# Patient Record
Sex: Female | Born: 1975 | Race: Black or African American | Hispanic: No | Marital: Single | Smoking: Current every day smoker
Health system: Southern US, Community
[De-identification: ages and names within clinical notes are randomized; demographics above are authoritative.]

## PROBLEM LIST (undated history)

## (undated) DIAGNOSIS — F329 Major depressive disorder, single episode, unspecified: Secondary | ICD-10-CM

## (undated) DIAGNOSIS — F319 Bipolar disorder, unspecified: Secondary | ICD-10-CM

## (undated) DIAGNOSIS — F32A Depression, unspecified: Secondary | ICD-10-CM

## (undated) DIAGNOSIS — I10 Essential (primary) hypertension: Secondary | ICD-10-CM

## (undated) DIAGNOSIS — R011 Cardiac murmur, unspecified: Secondary | ICD-10-CM

## (undated) DIAGNOSIS — D649 Anemia, unspecified: Secondary | ICD-10-CM

## (undated) HISTORY — PX: TUBAL LIGATION: SHX77

---

## 2005-09-06 ENCOUNTER — Emergency Department (HOSPITAL_COMMUNITY): Admission: EM | Admit: 2005-09-06 | Discharge: 2005-09-06 | Payer: Self-pay | Admitting: Emergency Medicine

## 2005-12-23 ENCOUNTER — Emergency Department (HOSPITAL_COMMUNITY): Admission: EM | Admit: 2005-12-23 | Discharge: 2005-12-23 | Payer: Self-pay | Admitting: Emergency Medicine

## 2006-02-13 ENCOUNTER — Emergency Department (HOSPITAL_COMMUNITY): Admission: EM | Admit: 2006-02-13 | Discharge: 2006-02-13 | Payer: Self-pay | Admitting: Emergency Medicine

## 2006-02-14 ENCOUNTER — Emergency Department (HOSPITAL_COMMUNITY): Admission: EM | Admit: 2006-02-14 | Discharge: 2006-02-14 | Payer: Self-pay | Admitting: Emergency Medicine

## 2006-02-22 ENCOUNTER — Emergency Department (HOSPITAL_COMMUNITY): Admission: EM | Admit: 2006-02-22 | Discharge: 2006-02-22 | Payer: Self-pay | Admitting: Emergency Medicine

## 2006-05-14 ENCOUNTER — Ambulatory Visit: Payer: Self-pay | Admitting: *Deleted

## 2006-05-14 ENCOUNTER — Ambulatory Visit: Payer: Self-pay | Admitting: Family Medicine

## 2007-04-29 ENCOUNTER — Encounter (INDEPENDENT_AMBULATORY_CARE_PROVIDER_SITE_OTHER): Payer: Self-pay | Admitting: *Deleted

## 2007-05-19 ENCOUNTER — Emergency Department (HOSPITAL_COMMUNITY): Admission: EM | Admit: 2007-05-19 | Discharge: 2007-05-19 | Payer: Self-pay | Admitting: Emergency Medicine

## 2007-12-10 ENCOUNTER — Emergency Department (HOSPITAL_COMMUNITY): Admission: EM | Admit: 2007-12-10 | Discharge: 2007-12-10 | Payer: Self-pay | Admitting: Family Medicine

## 2007-12-25 ENCOUNTER — Ambulatory Visit: Payer: Self-pay | Admitting: Family Medicine

## 2007-12-25 LAB — CONVERTED CEMR LAB
AST: 15 units/L (ref 0–37)
Albumin: 4.8 g/dL (ref 3.5–5.2)
Alkaline Phosphatase: 74 units/L (ref 39–117)
CO2: 24 meq/L (ref 19–32)
Calcium: 9.2 mg/dL (ref 8.4–10.5)
Cholesterol: 155 mg/dL (ref 0–200)
Creatinine, Ser: 0.83 mg/dL (ref 0.40–1.20)
Glucose, Bld: 72 mg/dL (ref 70–99)
Hemoglobin: 13.7 g/dL (ref 12.0–15.0)
Lymphocytes Relative: 27 % (ref 12–46)
Monocytes Absolute: 0.3 10*3/uL (ref 0.1–1.0)
Monocytes Relative: 6 % (ref 3–12)
Neutro Abs: 3.3 10*3/uL (ref 1.7–7.7)
Platelets: 167 10*3/uL (ref 150–400)
RDW: 17.5 % — ABNORMAL HIGH (ref 11.5–15.5)
Sodium: 140 meq/L (ref 135–145)
Total Protein: 8.1 g/dL (ref 6.0–8.3)

## 2008-09-14 ENCOUNTER — Emergency Department (HOSPITAL_COMMUNITY): Admission: EM | Admit: 2008-09-14 | Discharge: 2008-09-14 | Payer: Self-pay | Admitting: Emergency Medicine

## 2008-11-24 ENCOUNTER — Ambulatory Visit: Payer: Self-pay | Admitting: Family Medicine

## 2008-12-28 ENCOUNTER — Ambulatory Visit: Payer: Self-pay | Admitting: Family Medicine

## 2008-12-28 LAB — CONVERTED CEMR LAB
Calcium: 9 mg/dL (ref 8.4–10.5)
Chloride: 110 meq/L (ref 96–112)
Creatinine, Ser: 0.73 mg/dL (ref 0.40–1.20)
Glucose, Bld: 88 mg/dL (ref 70–99)
Hemoglobin: 10.3 g/dL — ABNORMAL LOW (ref 12.0–15.0)
MCHC: 31.4 g/dL (ref 30.0–36.0)
MCV: 67.6 fL — ABNORMAL LOW (ref 78.0–100.0)
Monocytes Absolute: 0.2 10*3/uL (ref 0.1–1.0)
Monocytes Relative: 6 % (ref 3–12)
Neutro Abs: 1.6 10*3/uL — ABNORMAL LOW (ref 1.7–7.7)
Potassium: 4 meq/L (ref 3.5–5.3)
RDW: 20.1 % — ABNORMAL HIGH (ref 11.5–15.5)
Sodium: 142 meq/L (ref 135–145)
WBC: 3.7 10*3/uL — ABNORMAL LOW (ref 4.0–10.5)

## 2009-03-03 ENCOUNTER — Emergency Department (HOSPITAL_COMMUNITY): Admission: EM | Admit: 2009-03-03 | Discharge: 2009-03-03 | Payer: Self-pay | Admitting: Emergency Medicine

## 2009-03-23 ENCOUNTER — Other Ambulatory Visit: Admission: RE | Admit: 2009-03-23 | Discharge: 2009-03-23 | Payer: Self-pay | Admitting: Family Medicine

## 2009-03-23 ENCOUNTER — Encounter (INDEPENDENT_AMBULATORY_CARE_PROVIDER_SITE_OTHER): Payer: Self-pay | Admitting: Family Medicine

## 2009-03-23 ENCOUNTER — Ambulatory Visit: Payer: Self-pay | Admitting: Family Medicine

## 2009-03-23 LAB — CONVERTED CEMR LAB: GC Probe Amp, Genital: NEGATIVE

## 2009-03-29 ENCOUNTER — Ambulatory Visit: Payer: Self-pay | Admitting: Family Medicine

## 2009-07-13 ENCOUNTER — Emergency Department (HOSPITAL_COMMUNITY): Admission: EM | Admit: 2009-07-13 | Discharge: 2009-07-13 | Payer: Self-pay | Admitting: Emergency Medicine

## 2009-09-29 ENCOUNTER — Emergency Department (HOSPITAL_COMMUNITY): Admission: EM | Admit: 2009-09-29 | Discharge: 2009-09-29 | Payer: Self-pay | Admitting: Emergency Medicine

## 2009-11-03 ENCOUNTER — Emergency Department (HOSPITAL_COMMUNITY): Admission: EM | Admit: 2009-11-03 | Discharge: 2009-11-03 | Payer: Self-pay | Admitting: Emergency Medicine

## 2009-12-11 ENCOUNTER — Emergency Department (HOSPITAL_COMMUNITY): Admission: EM | Admit: 2009-12-11 | Discharge: 2009-12-11 | Payer: Self-pay | Admitting: Emergency Medicine

## 2010-11-13 LAB — COMPREHENSIVE METABOLIC PANEL
ALT: 12 U/L (ref 0–35)
Albumin: 3.6 g/dL (ref 3.5–5.2)
Alkaline Phosphatase: 66 U/L (ref 39–117)
BUN: 9 mg/dL (ref 6–23)
CO2: 24 mEq/L (ref 19–32)
Calcium: 8.3 mg/dL — ABNORMAL LOW (ref 8.4–10.5)
Chloride: 103 mEq/L (ref 96–112)
Creatinine, Ser: 0.85 mg/dL (ref 0.4–1.2)
GFR calc Af Amer: >60 mL/min (ref >60)
Glucose, Bld: 90 mg/dL (ref 70–99)
Total Protein: 7.1 g/dL (ref 6.0–8.3)

## 2010-11-13 LAB — DIFFERENTIAL
Eosinophils Absolute: 0 10*3/uL (ref 0.0–0.7)
Lymphs Abs: 1 10*3/uL (ref 0.7–4.0)
Monocytes Absolute: 0.3 10*3/uL (ref 0.1–1.0)

## 2010-11-13 LAB — URINE CULTURE
Colony Count: NO GROWTH
Culture: NO GROWTH

## 2010-11-13 LAB — URINALYSIS, ROUTINE W REFLEX MICROSCOPIC
Bilirubin Urine: NEGATIVE
Ketones, ur: NEGATIVE mg/dL
Nitrite: NEGATIVE
Protein, ur: NEGATIVE mg/dL
Specific Gravity, Urine: 1.018 (ref 1.005–1.030)
Urobilinogen, UA: 1 mg/dL (ref 0.0–1.0)
pH: 6 (ref 5.0–8.0)

## 2010-11-13 LAB — CBC
MCV: 75.6 fL — ABNORMAL LOW (ref 78.0–100.0)
Platelets: 95 10*3/uL — ABNORMAL LOW (ref 150–400)

## 2010-11-13 LAB — URINE MICROSCOPIC-ADD ON

## 2010-11-13 LAB — D-DIMER, QUANTITATIVE: D-Dimer, Quant: <0.22 ug/mL-FEU (ref 0.00–0.48)

## 2011-05-23 LAB — COMPREHENSIVE METABOLIC PANEL
ALT: 14
AST: 21
Albumin: 4
Alkaline Phosphatase: 57
CO2: 24
Calcium: 8.9
Potassium: 3.5
Sodium: 137

## 2011-05-23 LAB — DIFFERENTIAL
Basophils Absolute: 0.1
Basophils Relative: 1
Eosinophils Absolute: 0
Lymphs Abs: 1.2
Monocytes Absolute: 0.3
Monocytes Relative: 5

## 2011-05-23 LAB — URINALYSIS, ROUTINE W REFLEX MICROSCOPIC
Glucose, UA: NEGATIVE
Ketones, ur: 40 — AB
Leukocytes, UA: NEGATIVE
Nitrite: NEGATIVE

## 2011-05-23 LAB — URINE MICROSCOPIC-ADD ON

## 2011-05-23 LAB — RAPID URINE DRUG SCREEN, HOSP PERFORMED
Benzodiazepines: NOT DETECTED
Tetrahydrocannabinol: POSITIVE — AB

## 2011-05-23 LAB — CBC
HCT: 36.2
Hemoglobin: 11.8 — ABNORMAL LOW
MCV: 78.2
Platelets: 133 — ABNORMAL LOW
RDW: 18.8 — ABNORMAL HIGH
WBC: 5.3

## 2011-05-23 LAB — ETHANOL: Alcohol, Ethyl (B): <5

## 2011-05-23 LAB — POCT PREGNANCY, URINE: Operator id: 27065

## 2011-10-29 ENCOUNTER — Other Ambulatory Visit: Payer: Self-pay

## 2011-10-29 ENCOUNTER — Emergency Department (HOSPITAL_COMMUNITY)
Admission: EM | Admit: 2011-10-29 | Discharge: 2011-10-29 | Disposition: A | Discharge status: Home / Self Care | Payer: Medicare Other | Attending: Emergency Medicine | Admitting: Emergency Medicine

## 2011-10-29 ENCOUNTER — Encounter (HOSPITAL_COMMUNITY): Payer: Self-pay | Admitting: *Deleted

## 2011-10-29 ENCOUNTER — Emergency Department (HOSPITAL_COMMUNITY): Payer: Medicare Other

## 2011-10-29 DIAGNOSIS — I10 Essential (primary) hypertension: Secondary | ICD-10-CM | POA: Insufficient documentation

## 2011-10-29 DIAGNOSIS — R079 Chest pain, unspecified: Secondary | ICD-10-CM | POA: Insufficient documentation

## 2011-10-29 DIAGNOSIS — R05 Cough: Secondary | ICD-10-CM

## 2011-10-29 DIAGNOSIS — R059 Cough, unspecified: Secondary | ICD-10-CM | POA: Insufficient documentation

## 2011-10-29 DIAGNOSIS — R0602 Shortness of breath: Secondary | ICD-10-CM | POA: Insufficient documentation

## 2011-10-29 HISTORY — DX: Anemia, unspecified: D64.9

## 2011-10-29 HISTORY — DX: Essential (primary) hypertension: I10

## 2011-10-29 MED ORDER — BENZONATATE 100 MG PO CAPS: 200.0000 mg | ORAL_CAPSULE | Freq: Two times a day (BID) | ORAL | Status: AC | PRN

## 2011-10-29 MED ORDER — BENZONATATE 100 MG PO CAPS: 200.0000 mg | ORAL_CAPSULE | Freq: Two times a day (BID) | ORAL | Status: DC | PRN

## 2011-10-29 NOTE — ED Notes (Addendum)
Pt here with cough for 4-5 years sts was in town visiting relatives and was woken up by aunt to come here forpersistent cough in her sleep, sts she ahs not been eatingright, she chest congestion, thigh pain, weakness in her legs, frequent falls and legs have been giving out, pt sts she feels run down and needs to be checked for cancer due to strong family hx and she is a smoker but she has neglected to see a doctor.

## 2011-10-29 NOTE — Discharge Instructions (Signed)
Your xray is normal - it has no signs of cancer or pneumonia.  Smoking has been shown to cause ongoing cough and inflammation in the lungs.  Stopping smoking immediately will reduce your risk for lung cancer, pneumonia and chronic lung disease.  Please take the tesesalon for your cough, call your doctor in the morning for a follow up exam and see the list below if you don't have a family doctor.  RESOURCE GUIDE  Dental Problems  Patients with Medicaid: [facility] [phone] [address]                                           [phone] [address] Phone:  [phone]                                                  Phone:  [phone]  If unable to pay or uninsured, contact:  [facility] or [facility]. to become qualified for the adult dental clinic.  Chronic Pain Problems Contact [facility]  [phone] Patients need to be referred by their primary care doctor.  Insufficient Money for Medicine Contact [employer]:  call "211" or [facility] [phone].  No Primary Care Doctor Call [facility]  [phone] Other agencies that provide inexpensive medical care    [facility]  [phone]    [facility]  [phone]    [facility]  [phone]    [facility]  [phone]    [facility]  [phone]    [facility]  [phone]  Psychological Services [facility]  [phone] [facility]  [phone] [facility]   [phone] (emergency services [phone])  Substance Abuse Resources [facility]  [phone] [facility] [phone] [facility] [phone] Floydene Flock [phone] Residential & Outpatient Substance Abuse Program  [phone]  Abuse/Neglect [facility] Child Abuse Hotline (607)325-8384 [facility] Child Abuse Hotline (910)809-4729 (After Hours)  Emergency  Shelter [facility] [phone]  Maternity Homes [facility] at the [place] [facility] [phone] [facility] [phone]  MRSA Hotline #:   586 025 0504    [facility] Resources  [facility]     [employer]                          [facility] Dept. [address] [place]                       [address]      [address] [place] [address]  Patrecia Pace  [facility] Phone:  [phone]                                   Phone:  [phone]                 Phone:  [place] Phone:  [place] [phone] (After Hours)

## 2011-10-29 NOTE — ED Provider Notes (Addendum)
History     CSN: [id number]  Arrival date & time 10/29/11  0403   First MD Initiated Contact with Patient 10/29/11 [phone]      Chief Complaint  Patient presents with  . Cough    (Consider location/radiation/quality/duration/timing/severity/associated sxs/prior treatment) HPI Comments: 36 year old female with a history of anemia and heavy tobacco use. She presents with a chronic cough which she states has been there for years, is worse at night, persistent from day today and not associated with phlegm. She has been coughing up a small amount of blood for a long time. She denies any swelling, rashes, diarrhea, nausea vomiting fevers chills or back pain. Symptoms are mild this evening. She states that she is here because her Aunt who is dying of cancer one of her to come be checked for cancer because of her chronic cough.  Her mother also died of lung cancer last year.  Pt also c/o CP which has been daily for a "long time".  She states it is there most of the day and worse with coughing.  Patient is a 36 y.o. female presenting with cough. The history is provided by the patient.  Cough    Past Medical History  Diagnosis Date  . Anemia   . Hypertension     History reviewed. No pertinent past surgical history.  No family history on file.  History  Substance Use Topics  . Smoking status: Current Everyday Smoker -- 1.0 packs/day  . Smokeless tobacco: Not on file  . Alcohol Use: No    OB History    Grav Para Term Preterm Abortions TAB SAB Ect Mult Living                  Review of Systems  Respiratory: Positive for cough.   All other systems reviewed and are negative.    Allergies  Penicillins  Home Medications   Current Outpatient Rx  Name Route Sig Dispense Refill  . BENZONATATE 100 MG PO CAPS Oral Take 2 capsules (200 mg total) by mouth 2 (two) times daily as needed for cough. 20 capsule 0    BP 168/95  Pulse 57  Temp(Src) 98.1 F (36.7 C) (Oral)  Resp 16   SpO2 99%  LMP 10/22/2011  Physical Exam  Nursing note and vitals reviewed. Constitutional: She appears well-developed and well-nourished. No distress.  HENT:  Head: Normocephalic and atraumatic.  Mouth/Throat: Oropharynx is clear and moist. No oropharyngeal exudate.  Eyes: Conjunctivae and EOM are normal. Pupils are equal, round, and reactive to light. Right eye exhibits no discharge. Left eye exhibits no discharge. No scleral icterus.  Neck: Normal range of motion. Neck supple. No JVD present. No thyromegaly present.  Cardiovascular: Normal rate, regular rhythm, normal heart sounds and intact distal pulses.  Exam reveals no gallop and no friction rub.   No murmur heard. Pulmonary/Chest: Effort normal and breath sounds normal. No respiratory distress. She has no wheezes. She has no rales.  Abdominal: Soft. Bowel sounds are normal. She exhibits no distension and no mass. There is no tenderness.  Musculoskeletal: Normal range of motion. She exhibits no edema and no tenderness.  Lymphadenopathy:    She has no cervical adenopathy.  Neurological: She is alert. Coordination normal.  Skin: Skin is warm and dry. No rash noted. No erythema.  Psychiatric: She has a normal mood and affect. Her behavior is normal.    ED Course  Procedures (including critical care time)  ED ECG REPORT   Date: 10/29/2011  Rate: 49  Rhythm: sinus bradycardia  QRS Axis: normal  Intervals: normal  ST/T Wave abnormalities: normal  Conduction Disutrbances:none  Narrative Interpretation:   Old EKG Reviewed: none available   Labs Reviewed - No data to display Dg Chest 2 View  10/29/2011  *RADIOLOGY REPORT*  Clinical Data: Cough, shortness of breath and chest pain. Hemoptysis and diarrhea.  CHEST - 2 VIEW  Comparison: Chest radiograph performed 07/13/2009  Findings: The lungs are well-aerated and clear.  There is no evidence of focal opacification, pleural effusion or pneumothorax.  The heart is normal in size;  the mediastinal contour is within normal limits.  No acute osseous abnormalities are seen.  IMPRESSION: No acute cardiopulmonary process seen.  Original Report Authenticated By: Tonia Ghent, M.D.     1. Cough       MDM  Oxygen saturation 99% on room air, no acute cardiac findings, soft abdomen, no edema, well-appearing. She does have mild hypertension. Will check chest x-ray to rule out infiltrates or masses, patient agrees to followup with family doctor in the community on discharge  CXR negative - reviewed by myself.  ECG normal  D/w pt - will encourage tobacco cesastion, tessalon for outpt cough and referral to family docs.     Vida Roller, MD 10/29/11 4098  Vida Roller, MD 10/29/11 [phone]

## 2011-10-29 NOTE — ED Notes (Signed)
Was visiting aunt upstairs, aunt was worried about pt's cough, pt admits to neglecting self, never seeing the doctor and ignoring lots of sx: mentions sx have been ongoing for months and years. Pt c/o or mentions: cough, chest hurting, cold sx, coughing up blood, some diarrhea, some nv, some hemoptysis. Admits to: CP, abd pain and back pain, rates pain 10/10. Alert, NAD, calm, skin W&D, resps e/u, speaking softly with poor eye contact.

## 2013-07-01 ENCOUNTER — Emergency Department (HOSPITAL_COMMUNITY)
Admission: EM | Admit: 2013-07-01 | Discharge: 2013-07-01 | Disposition: A | Discharge status: Home / Self Care | Payer: Medicare Other | Attending: Emergency Medicine | Admitting: Emergency Medicine

## 2013-07-01 ENCOUNTER — Encounter (HOSPITAL_COMMUNITY): Payer: Self-pay | Admitting: Emergency Medicine

## 2013-07-01 DIAGNOSIS — F17200 Nicotine dependence, unspecified, uncomplicated: Secondary | ICD-10-CM | POA: Insufficient documentation

## 2013-07-01 DIAGNOSIS — R5381 Other malaise: Secondary | ICD-10-CM | POA: Insufficient documentation

## 2013-07-01 DIAGNOSIS — N926 Irregular menstruation, unspecified: Secondary | ICD-10-CM | POA: Insufficient documentation

## 2013-07-01 DIAGNOSIS — I10 Essential (primary) hypertension: Secondary | ICD-10-CM | POA: Insufficient documentation

## 2013-07-01 DIAGNOSIS — Z880 Allergy status to penicillin: Secondary | ICD-10-CM | POA: Insufficient documentation

## 2013-07-01 DIAGNOSIS — Z862 Personal history of diseases of the blood and blood-forming organs and certain disorders involving the immune mechanism: Secondary | ICD-10-CM | POA: Insufficient documentation

## 2013-07-01 DIAGNOSIS — K047 Periapical abscess without sinus: Secondary | ICD-10-CM | POA: Insufficient documentation

## 2013-07-01 DIAGNOSIS — R634 Abnormal weight loss: Secondary | ICD-10-CM | POA: Insufficient documentation

## 2013-07-01 DIAGNOSIS — R531 Weakness: Secondary | ICD-10-CM

## 2013-07-01 DIAGNOSIS — R112 Nausea with vomiting, unspecified: Secondary | ICD-10-CM | POA: Insufficient documentation

## 2013-07-01 DIAGNOSIS — Z3202 Encounter for pregnancy test, result negative: Secondary | ICD-10-CM | POA: Insufficient documentation

## 2013-07-01 LAB — URINALYSIS, ROUTINE W REFLEX MICROSCOPIC
Glucose, UA: NEGATIVE mg/dL
Ketones, ur: 15 mg/dL — AB
Leukocytes, UA: NEGATIVE
Nitrite: NEGATIVE
Protein, ur: 100 mg/dL — AB

## 2013-07-01 LAB — CBC WITH DIFFERENTIAL/PLATELET
Basophils Absolute: 0 10*3/uL (ref 0.0–0.1)
Basophils Relative: 0 % (ref 0–1)
Eosinophils Relative: 1 % (ref 0–5)
HCT: 39.3 % (ref 36.0–46.0)
MCHC: 34.9 g/dL (ref 30.0–36.0)
MCV: 81 fL (ref 78.0–100.0)
Monocytes Absolute: 0.3 10*3/uL (ref 0.1–1.0)
RDW: 15.3 % (ref 11.5–15.5)

## 2013-07-01 LAB — BASIC METABOLIC PANEL
Calcium: 8.8 mg/dL (ref 8.4–10.5)
Creatinine, Ser: 0.85 mg/dL (ref 0.50–1.10)
GFR calc Af Amer: >90 mL/min (ref >90)
GFR calc non Af Amer: 86 mL/min — ABNORMAL LOW (ref >90)

## 2013-07-01 LAB — URINE MICROSCOPIC-ADD ON

## 2013-07-01 MED ORDER — HYDROCODONE-ACETAMINOPHEN 5-325 MG PO TABS: 1.0000 | ORAL_TABLET | ORAL | Status: DC | PRN

## 2013-07-01 MED ORDER — IBUPROFEN 800 MG PO TABS
800.0000 mg | ORAL_TABLET | Freq: Once | ORAL | Status: AC
  Administered 2013-07-01: 800 mg via ORAL
  Filled 2013-07-01: qty 1

## 2013-07-01 MED ORDER — CLINDAMYCIN HCL 150 MG PO CAPS: 300.0000 mg | ORAL_CAPSULE | Freq: Three times a day (TID) | ORAL | Status: DC

## 2013-07-01 MED ORDER — HYDROCODONE-ACETAMINOPHEN 5-325 MG PO TABS
1.0000 | ORAL_TABLET | Freq: Once | ORAL | Status: DC
  Filled 2013-07-01: qty 1

## 2013-07-01 MED ORDER — ONDANSETRON HCL 4 MG PO TABS: 4.0000 mg | ORAL_TABLET | Freq: Four times a day (QID) | ORAL | Status: DC

## 2013-07-01 MED ORDER — SODIUM CHLORIDE 0.9 % IV BOLUS (SEPSIS)
1000.0000 mL | Freq: Once | INTRAVENOUS | Status: AC
  Administered 2013-07-01: 1000 mL via INTRAVENOUS

## 2013-07-01 NOTE — ED Notes (Signed)
Pt comfortable with d/c and f/u instructions. Prescriptions x2. 

## 2013-07-01 NOTE — ED Provider Notes (Signed)
Medical screening examination/treatment/procedure(s) were performed by non-physician practitioner and as supervising physician I was immediately available for consultation/collaboration.    Celene Kras, MD 07/01/13 [phone]

## 2013-07-01 NOTE — ED Notes (Signed)
Pt reports not feeling well x 6 months. Having generalized fatigue, weakness, n/v, weight loss. Swelling noted to left face and reports its due to needing a root canal for extended amount of time. Family states pt was sleeping this am and her eyes were rolling back in her head. Airway intact, no acute distress noted at this time.

## 2013-07-01 NOTE — ED Notes (Signed)
Pt states is allergic to Tylenol and declines vicodin at this time. Pt states will take Motrin instead. EDPA made aware, orders for medication received.

## 2013-07-01 NOTE — ED Provider Notes (Signed)
CSN: [id number]     Arrival date & time 07/01/13  1017 History   First MD Initiated Contact with Patient 07/01/13 1026     Chief Complaint  Patient presents with  . Emesis  . Weight Loss   (Consider location/radiation/quality/duration/timing/severity/associated sxs/prior Treatment) Patient is a 37 y.o. female presenting with vomiting. The history is provided by the patient. No language interpreter was used.  Emesis Severity:  Moderate Associated symptoms comment:  She states she has been feeling unwell for the past several months. Symptoms include vomiting with PO intake, weight loss, intermittent facial swelling, generalized weakness, irregular menses. She states today, her son became concerned after she was difficult to awaken and asked her to come for evaluation. She cannot quantify weight loss.    Past Medical History  Diagnosis Date  . Anemia   . Hypertension    History reviewed. No pertinent past surgical history. History reviewed. No pertinent family history. History  Substance Use Topics  . Smoking status: Current Every Day Smoker -- 1.00 packs/day  . Smokeless tobacco: Not on file  . Alcohol Use: No   OB History   Grav Para Term Preterm Abortions TAB SAB Ect Mult Living                 Review of Systems  Constitutional: Positive for appetite change and unexpected weight change. Negative for fever.  HENT: Positive for dental problem and facial swelling. Negative for trouble swallowing.   Respiratory: Negative for cough and shortness of breath.   Gastrointestinal: Positive for nausea and vomiting.  Genitourinary: Positive for menstrual problem. Negative for dysuria.  Skin: Negative for rash.  Neurological: Positive for weakness.    Allergies  Penicillins and Aspirin  Home Medications  No current outpatient prescriptions on file. BP 164/108  Pulse 86  Temp(Src) 98.6 F (37 C) (Oral)  Resp 18  SpO2 99% Physical Exam  Constitutional: She is oriented to  person, place, and time. She appears well-developed and well-nourished.  HENT:  Head: Normocephalic.  Mouth/Throat: Oropharynx is clear and moist.  Left facial swelling over forward mandible. Generally poor dentition. No visualized pointing abscess.   Neck: Normal range of motion. Neck supple.  Cardiovascular: Normal rate and regular rhythm.   Pulmonary/Chest: Effort normal and breath sounds normal.  Abdominal: Soft. Bowel sounds are normal. There is no tenderness. There is no rebound and no guarding.  Musculoskeletal: Normal range of motion.  Neurological: She is alert and oriented to person, place, and time.  Skin: Skin is warm and dry. No rash noted.  Psychiatric: She has a normal mood and affect.    ED Course  Procedures (including critical care time) Labs Review Labs Reviewed  URINALYSIS, ROUTINE W REFLEX MICROSCOPIC - Abnormal; Notable for the following:    APPearance CLOUDY (*)    Hgb urine dipstick LARGE (*)    Bilirubin Urine SMALL (*)    Ketones, ur 15 (*)    Protein, ur 100 (*)    All other components within normal limits  PREGNANCY, URINE - Abnormal; Notable for the following:    Preg Test, Ur NEGATIVE (*)    All other components within normal limits  URINE MICROSCOPIC-ADD ON - Abnormal; Notable for the following:    Squamous Epithelial / LPF FEW (*)    All other components within normal limits  CBC WITH DIFFERENTIAL  BASIC METABOLIC PANEL   Imaging Review No results found.  EKG Interpretation   None       MDM  No diagnosis found. 1. Dental abscess 2. Weight loss 3. Generalized weakness  She appears stable from a clinical standpoint. No anemia as per history, no orthostasis, VSS, chemistries WNL. Urine without infection. She can be treated with symptomatic treatment of recurrent nausea and abx for dental infection and be discharged for outpatient follow up.    Arnoldo Hooker, PA-C 07/01/13 1320

## 2013-08-06 ENCOUNTER — Encounter (HOSPITAL_COMMUNITY): Payer: Self-pay | Admitting: Emergency Medicine

## 2013-08-06 ENCOUNTER — Emergency Department (HOSPITAL_COMMUNITY)
Admission: EM | Admit: 2013-08-06 | Discharge: 2013-08-06 | Discharge status: Against Medical Advice | Payer: Medicare Other | Attending: Emergency Medicine | Admitting: Emergency Medicine

## 2013-08-06 DIAGNOSIS — F3289 Other specified depressive episodes: Secondary | ICD-10-CM | POA: Insufficient documentation

## 2013-08-06 DIAGNOSIS — F329 Major depressive disorder, single episode, unspecified: Secondary | ICD-10-CM | POA: Insufficient documentation

## 2013-08-06 DIAGNOSIS — I10 Essential (primary) hypertension: Secondary | ICD-10-CM | POA: Insufficient documentation

## 2013-08-06 DIAGNOSIS — F17200 Nicotine dependence, unspecified, uncomplicated: Secondary | ICD-10-CM | POA: Insufficient documentation

## 2013-08-06 HISTORY — DX: Major depressive disorder, single episode, unspecified: F32.9

## 2013-08-06 HISTORY — DX: Bipolar disorder, unspecified: F31.9

## 2013-08-06 HISTORY — DX: Depression, unspecified: F32.A

## 2013-08-06 NOTE — ED Notes (Signed)
Pt. wanded by security at triage , personal belongings bagged and labelled .

## 2013-08-06 NOTE — ED Notes (Signed)
Pt. requesting psychiatric evaluation for her depression / bipolar disease , she stated that she has not taken her Seroquel for several weeks , denies suicidal ideation .

## 2013-08-06 NOTE — ED Notes (Signed)
Paper scrubs given to pt. at triage  / security paged to wand pt.

## 2013-08-24 ENCOUNTER — Inpatient Hospital Stay (HOSPITAL_COMMUNITY)
Admission: AD | Admit: 2013-08-24 | Discharge: 2013-08-24 | Disposition: A | Discharge status: Home / Self Care | Payer: Medicare Other | Source: Ambulatory Visit | Attending: Obstetrics & Gynecology | Admitting: Obstetrics & Gynecology

## 2013-08-24 ENCOUNTER — Encounter (HOSPITAL_COMMUNITY): Payer: Self-pay | Admitting: *Deleted

## 2013-08-24 DIAGNOSIS — F17200 Nicotine dependence, unspecified, uncomplicated: Secondary | ICD-10-CM | POA: Insufficient documentation

## 2013-08-24 DIAGNOSIS — A5901 Trichomonal vulvovaginitis: Secondary | ICD-10-CM | POA: Insufficient documentation

## 2013-08-24 DIAGNOSIS — N938 Other specified abnormal uterine and vaginal bleeding: Secondary | ICD-10-CM | POA: Insufficient documentation

## 2013-08-24 DIAGNOSIS — N949 Unspecified condition associated with female genital organs and menstrual cycle: Secondary | ICD-10-CM | POA: Insufficient documentation

## 2013-08-24 DIAGNOSIS — N926 Irregular menstruation, unspecified: Secondary | ICD-10-CM | POA: Insufficient documentation

## 2013-08-24 HISTORY — DX: Cardiac murmur, unspecified: R01.1

## 2013-08-24 LAB — URINE MICROSCOPIC-ADD ON

## 2013-08-24 LAB — WET PREP, GENITAL
CLUE CELLS WET PREP: NONE SEEN
TRICH WET PREP: NONE SEEN
Yeast Wet Prep HPF POC: NONE SEEN

## 2013-08-24 LAB — URINALYSIS, ROUTINE W REFLEX MICROSCOPIC
Bilirubin Urine: NEGATIVE
GLUCOSE, UA: NEGATIVE mg/dL
KETONES UR: NEGATIVE mg/dL
LEUKOCYTES UA: NEGATIVE
Nitrite: NEGATIVE
PROTEIN: NEGATIVE mg/dL
Specific Gravity, Urine: >1.03 — ABNORMAL HIGH (ref 1.005–1.030)
UROBILINOGEN UA: 0.2 mg/dL (ref 0.0–1.0)
pH: 6 (ref 5.0–8.0)

## 2013-08-24 LAB — CBC
HEMATOCRIT: 36.2 % (ref 36.0–46.0)
HEMOGLOBIN: 12.2 g/dL (ref 12.0–15.0)
MCH: 26.6 pg (ref 26.0–34.0)
MCHC: 33.7 g/dL (ref 30.0–36.0)
MCV: 79 fL (ref 78.0–100.0)
Platelets: 112 10*3/uL — ABNORMAL LOW (ref 150–400)
RBC: 4.58 MIL/uL (ref 3.87–5.11)
RDW: 15.6 % — ABNORMAL HIGH (ref 11.5–15.5)
WBC: 4.7 10*3/uL (ref 4.0–10.5)

## 2013-08-24 LAB — POCT PREGNANCY, URINE: PREG TEST UR: NEGATIVE

## 2013-08-24 MED ORDER — METRONIDAZOLE 500 MG PO TABS
2000.0000 mg | ORAL_TABLET | ORAL | Status: AC
  Administered 2013-08-24: 2000 mg via ORAL
  Filled 2013-08-24: qty 4

## 2013-08-24 NOTE — Discharge Instructions (Signed)

## 2013-08-24 NOTE — MAU Note (Signed)
Period started 01/02, has continued, will slow up and then comes back heavy," is blood clocking a lot".  Doesn't  Have normal cramps, but is having sharp pains more to left side.  Hair is also falling out.

## 2013-08-24 NOTE — MAU Provider Note (Signed)
Chief Complaint: Vaginal Bleeding   First Provider Initiated Contact with Patient 08/24/13 1112     SUBJECTIVE HPI: Amber Ramirez is a 38 y.o. G3P3003 who presents to maternity admissions reporting menses x2 this month, with onset of bright red vaginal bleeding requiring pad change Q 2-3 hours yesterday.  She reports her period started 08/13/13 and was 7 days long like normal.  She has never had irregular bleeding before.  She was prescribed medication for trichomonas in October in [place], and picked up the prescription but it was left in a bag when she moved to [place] and she never took it.  She denies abdominal pain, vaginal itching/burning, urinary symptoms, h/a, dizziness, n/v, or fever/chills.  Past Medical History  Diagnosis Date  . Anemia   . Hypertension   . Bipolar 1 disorder   . Depression   . Heart murmur    Past Surgical History  Procedure Laterality Date  . Tubal ligation     History   Social History  . Marital Status: Single    Spouse Name: N/A    Number of Children: N/A  . Years of Education: N/A   Occupational History  . Not on file.   Social History Main Topics  . Smoking status: Current Every Day Smoker -- 1.00 packs/day for 16 years    Types: Cigars  . Smokeless tobacco: Never Used     Comment: black and mild-4-5/day  . Alcohol Use: No  . Drug Use: No  . Sexual Activity: Yes    Birth Control/ Protection: Surgical   Other Topics Concern  . Not on file   Social History Narrative  . No narrative on file   No current facility-administered medications on file prior to encounter.   No current outpatient prescriptions on file prior to encounter.   Allergies  Allergen Reactions  . Penicillins Shortness Of Breath  . Aspirin Swelling  . Tylenol [Acetaminophen] Swelling    ROS: Pertinent items in HPI  OBJECTIVE Blood pressure 174/80, pulse 71, temperature 98.2 F (36.8 C), temperature source Oral, resp. rate 20, last menstrual period  07/26/2013. GENERAL: Well-developed, well-nourished female in no acute distress.  HEENT: Normocephalic HEART: normal rate RESP: normal effort ABDOMEN: Soft, non-tender EXTREMITIES: Nontender, no edema NEURO: Alert and oriented Pelvic exam: Cervix pink, visually closed, without lesion, moderate amount dark red blood without clots, vaginal walls and external genitalia normal Bimanual exam: Cervix 0/long/high, firm, anterior, neg CMT, uterus nontender, nonenlarged, adnexa without tenderness, enlargement, or mass  LAB RESULTS Results for orders placed during the hospital encounter of 08/24/13 (from the past 24 hour(s))  URINALYSIS, ROUTINE W REFLEX MICROSCOPIC     Status: Abnormal   Collection Time    08/24/13  9:26 AM      Result Value Range   Color, Urine YELLOW  YELLOW   APPearance CLOUDY (*) CLEAR   Specific Gravity, Urine >1.030 (*) 1.005 - 1.030   pH 6.0  5.0 - 8.0   Glucose, UA NEGATIVE  NEGATIVE mg/dL   Hgb urine dipstick LARGE (*) NEGATIVE   Bilirubin Urine NEGATIVE  NEGATIVE   Ketones, ur NEGATIVE  NEGATIVE mg/dL   Protein, ur NEGATIVE  NEGATIVE mg/dL   Urobilinogen, UA 0.2  0.0 - 1.0 mg/dL   Nitrite NEGATIVE  NEGATIVE   Leukocytes, UA NEGATIVE  NEGATIVE  URINE MICROSCOPIC-ADD ON     Status: Abnormal   Collection Time    08/24/13  9:26 AM      Result Value Range  Squamous Epithelial / LPF FEW (*) RARE   RBC / HPF 21-50  <3 RBC/hpf   Bacteria, UA RARE  RARE  POCT PREGNANCY, URINE     Status: None   Collection Time    08/24/13  9:48 AM      Result Value Range   Preg Test, Ur NEGATIVE  NEGATIVE  WET PREP, GENITAL     Status: Abnormal   Collection Time    08/24/13 12:30 PM      Result Value Range   Yeast Wet Prep HPF POC NONE SEEN  NONE SEEN   Trich, Wet Prep NONE SEEN  NONE SEEN   Clue Cells Wet Prep HPF POC NONE SEEN  NONE SEEN   WBC, Wet Prep HPF POC FEW (*) NONE SEEN  CBC     Status: Abnormal   Collection Time    08/24/13 12:35 PM      Result Value Range    WBC 4.7  4.0 - 10.5 K/uL   RBC 4.58  3.87 - 5.11 MIL/uL   Hemoglobin 12.2  12.0 - 15.0 g/dL   HCT 47.836.2  29.536.0 - 62.146.0 %   MCV 79.0  78.0 - 100.0 fL   MCH 26.6  26.0 - 34.0 pg   MCHC 33.7  30.0 - 36.0 g/dL   RDW 30.815.6 (*) 65.711.5 - 84.615.5 %   Platelets 112 (*) 150 - 400 K/uL    ASSESSMENT 1. Irregular menstrual bleeding   2. Trichomonal vaginitis     PLAN Flagyl 2000 mg x1 dose in MAU given with food Discharge home Recommend partner treatment at health dept, no intercourse x1 week following tx F/U in gyn clinic for endometrial biopsy, Pap Return to MAU as needed    Medication List         ibuprofen 200 MG tablet  Commonly known as:  ADVIL,MOTRIN  Take 400-800 mg by mouth every 2 (two) hours as needed for mild pain or moderate pain.     multivitamin with minerals Tabs tablet  Take 1 tablet by mouth daily.       Follow-up Information   [facility]. (The clinic will call you with an appointment. Return to MAU as needed.)    Specialty:  Obstetrics and Gynecology   Contact information:   [address] [place] [id number] [phone]      Sharen CounterLisa Leftwich-Kirby Certified Nurse-Midwife 08/24/2013  3:29 PM

## 2013-08-25 ENCOUNTER — Encounter: Payer: Self-pay | Admitting: Obstetrics & Gynecology

## 2013-08-25 LAB — GC/CHLAMYDIA PROBE AMP
CT PROBE, AMP APTIMA: NEGATIVE
GC PROBE AMP APTIMA: NEGATIVE

## 2013-08-30 NOTE — MAU Provider Note (Signed)
Attestation of Attending Supervision of Advanced Practitioner (CNM/NP): Evaluation and management procedures were performed by the Advanced Practitioner under my supervision and collaboration. I have reviewed the Advanced Practitioner's note and chart, and I agree with the management and plan.  LEGGETT,KELLY H. 9:46 AM

## 2013-10-04 ENCOUNTER — Other Ambulatory Visit: Payer: Medicare Other | Admitting: Obstetrics & Gynecology

## 2013-12-22 ENCOUNTER — Encounter (HOSPITAL_COMMUNITY): Payer: Self-pay | Admitting: Emergency Medicine

## 2013-12-22 ENCOUNTER — Emergency Department (HOSPITAL_COMMUNITY): Payer: Medicare Other

## 2013-12-22 ENCOUNTER — Emergency Department (HOSPITAL_COMMUNITY)
Admission: EM | Admit: 2013-12-22 | Discharge: 2013-12-22 | Disposition: A | Discharge status: Home / Self Care | Payer: Medicare Other | Attending: Emergency Medicine | Admitting: Emergency Medicine

## 2013-12-22 DIAGNOSIS — Z791 Long term (current) use of non-steroidal anti-inflammatories (NSAID): Secondary | ICD-10-CM | POA: Insufficient documentation

## 2013-12-22 DIAGNOSIS — Z79899 Other long term (current) drug therapy: Secondary | ICD-10-CM | POA: Insufficient documentation

## 2013-12-22 DIAGNOSIS — R0602 Shortness of breath: Secondary | ICD-10-CM | POA: Insufficient documentation

## 2013-12-22 DIAGNOSIS — F419 Anxiety disorder, unspecified: Secondary | ICD-10-CM

## 2013-12-22 DIAGNOSIS — Z880 Allergy status to penicillin: Secondary | ICD-10-CM | POA: Insufficient documentation

## 2013-12-22 DIAGNOSIS — R079 Chest pain, unspecified: Secondary | ICD-10-CM | POA: Insufficient documentation

## 2013-12-22 DIAGNOSIS — Z862 Personal history of diseases of the blood and blood-forming organs and certain disorders involving the immune mechanism: Secondary | ICD-10-CM | POA: Insufficient documentation

## 2013-12-22 DIAGNOSIS — F17200 Nicotine dependence, unspecified, uncomplicated: Secondary | ICD-10-CM | POA: Insufficient documentation

## 2013-12-22 DIAGNOSIS — F319 Bipolar disorder, unspecified: Secondary | ICD-10-CM | POA: Insufficient documentation

## 2013-12-22 DIAGNOSIS — F411 Generalized anxiety disorder: Secondary | ICD-10-CM | POA: Insufficient documentation

## 2013-12-22 DIAGNOSIS — R011 Cardiac murmur, unspecified: Secondary | ICD-10-CM | POA: Insufficient documentation

## 2013-12-22 DIAGNOSIS — I10 Essential (primary) hypertension: Secondary | ICD-10-CM | POA: Insufficient documentation

## 2013-12-22 LAB — I-STAT TROPONIN, ED
Troponin i, poc: 0.01 ng/mL (ref 0.00–0.08)
Troponin i, poc: 0 ng/mL (ref 0.00–0.08)

## 2013-12-22 LAB — CBC WITH DIFFERENTIAL/PLATELET
Basophils Absolute: 0.1 10*3/uL (ref 0.0–0.1)
Basophils Relative: 1 % (ref 0–1)
EOS PCT: 1 % (ref 0–5)
Eosinophils Absolute: 0.1 10*3/uL (ref 0.0–0.7)
HCT: 35.8 % — ABNORMAL LOW (ref 36.0–46.0)
Hemoglobin: 11.8 g/dL — ABNORMAL LOW (ref 12.0–15.0)
LYMPHS ABS: 0.9 10*3/uL (ref 0.7–4.0)
Lymphocytes Relative: 24 % (ref 12–46)
MCH: 26.5 pg (ref 26.0–34.0)
MCHC: 33 g/dL (ref 30.0–36.0)
MCV: 80.3 fL (ref 78.0–100.0)
Monocytes Absolute: 0.2 10*3/uL (ref 0.1–1.0)
Monocytes Relative: 5 % (ref 3–12)
NEUTROS PCT: 68 % (ref 43–77)
Neutro Abs: 2.4 10*3/uL (ref 1.7–7.7)
PLATELETS: 105 10*3/uL — AB (ref 150–400)
RBC: 4.46 MIL/uL (ref 3.87–5.11)
RDW: 16 % — ABNORMAL HIGH (ref 11.5–15.5)
WBC: 3.5 10*3/uL — ABNORMAL LOW (ref 4.0–10.5)

## 2013-12-22 LAB — COMPREHENSIVE METABOLIC PANEL
ALK PHOS: 66 U/L (ref 39–117)
ALT: 11 U/L (ref 0–35)
AST: 19 U/L (ref 0–37)
Albumin: 3.4 g/dL — ABNORMAL LOW (ref 3.5–5.2)
BUN: 14 mg/dL (ref 6–23)
CO2: 22 meq/L (ref 19–32)
Calcium: 8.8 mg/dL (ref 8.4–10.5)
Chloride: 103 mEq/L (ref 96–112)
Creatinine, Ser: 0.78 mg/dL (ref 0.50–1.10)
GLUCOSE: 146 mg/dL — AB (ref 70–99)
POTASSIUM: 3.4 meq/L — AB (ref 3.7–5.3)
SODIUM: 139 meq/L (ref 137–147)
Total Bilirubin: 1 mg/dL (ref 0.3–1.2)
Total Protein: 7 g/dL (ref 6.0–8.3)

## 2013-12-22 MED ORDER — LORAZEPAM 2 MG/ML IJ SOLN
1.0000 mg | Freq: Once | INTRAMUSCULAR | Status: AC
  Administered 2013-12-22: 1 mg via INTRAMUSCULAR
  Filled 2013-12-22: qty 1

## 2013-12-22 MED ORDER — LORAZEPAM 1 MG PO TABS
1.0000 mg | ORAL_TABLET | Freq: Once | ORAL | Status: AC
  Administered 2013-12-22: 1 mg via ORAL
  Filled 2013-12-22: qty 1

## 2013-12-22 NOTE — ED Notes (Signed)
Pt called out to nursing station reporting difficulty breathing and chest pain.  This RN went in to assess pt.  Pt anxious and hyperventilating.  Significant other at bedside being argumentative with RN about plan of care.  This RN asked pt to describe pain and current complaints.  Pt's significant other interrupting pt while talking to RN and attempting to answer for pt.  This RN asked significant other to allow pt to talk to RN.  Significant other rolling eyes and mumbling under breath.  Pt spoke to RN with significant other at bedside.  PA made aware of complaints and BP being 200/100.

## 2013-12-22 NOTE — ED Notes (Signed)
Pt presents to department for evaluation of midsternal non radiating chest pain and headache. States ongoing for several months. 10/10 pain upon arrival to ED. Respirations unlabored. Pt is alert and oriented x4.

## 2013-12-22 NOTE — ED Notes (Signed)
Upon entering into patients room her significant other was at bedside and was being argumentive with patient and myself. PA made aware.

## 2013-12-22 NOTE — ED Provider Notes (Signed)
CSN: [id number]     Arrival date & time 12/22/13  1223 History   First MD Initiated Contact with Patient 12/22/13 1550     Chief Complaint  Patient presents with  . Chest Pain  . Headache     (Consider location/radiation/quality/duration/timing/severity/associated sxs/prior Treatment) The history is provided by the patient and medical records. No language interpreter was used.    Amber Ramirez is a 38 y.o. female  with a hx of anemia, HTN, presents to the Emergency Department complaining of intermittent sharp stabbing substernal CP onset January 2015.  Pt reports that during the episode she begins to have sweaty hands, rapid breathing, mild SOB and chest tightness.  Pt reports that stress aggravates and sometimes precipiates these symptoms.  She reports she is under a tremendous stress trying to deal with her children. She reports that the courts have ordered that she states here in [place] and careful  in and her mother-in-law.   He reports that she has the symptoms every time she has an argument with someone. Pt rates pain at a 6/10, but has been a 10/10 at its worst. Pt reports she usually has an associated frontal and bilateral headache which accompanies her chest pain episodes, but this is completely resolved today.  Pt reports taking ibuprofen with moderate relief.  Pt reports that she was arguing with her grandmother this afternoon which precipitated the event and the last event was 4 days ago, also precipitated by an argument. Patient reports that she is seeing a physician and have her help for her depression and was recently taken off her Prozac. She reports this has not changed her symptoms.  He should also reports decreased appetite for the last several months due to her stress and often after these stressful episodes she feels the need to have loose stool.  Pt denies fever, chills, neck pain, abd pain, N/V, weakness, dizziness, syncope, dysuria.   Patient reports she "does not have  time to have a primary care physician."   Past Medical History  Diagnosis Date  . Anemia   . Hypertension   . Bipolar 1 disorder   . Depression   . Heart murmur    Past Surgical History  Procedure Laterality Date  . Tubal ligation     Family History  Problem Relation Age of Onset  . Schizophrenia Mother   . Cancer Mother     lung  . Hypertension Mother   . Diabetes Father    History  Substance Use Topics  . Smoking status: Current Every Day Smoker -- 1.00 packs/day for 16 years    Types: Cigars  . Smokeless tobacco: Never Used     Comment: black and mild-4-5/day  . Alcohol Use: No   OB History   Grav Para Term Preterm Abortions TAB SAB Ect Mult Living   3 3 3       3      Review of Systems  Constitutional: Negative for fever, diaphoresis, appetite change, fatigue and unexpected weight change.  HENT: Negative for mouth sores.   Eyes: Negative for visual disturbance.  Respiratory: Positive for chest tightness and shortness of breath. Negative for cough and wheezing.   Cardiovascular: Positive for chest pain.  Gastrointestinal: Negative for nausea, vomiting, abdominal pain, diarrhea and constipation.  Endocrine: Negative for polydipsia, polyphagia and polyuria.  Genitourinary: Negative for dysuria, urgency, frequency and hematuria.  Musculoskeletal: Negative for back pain and neck stiffness.  Skin: Negative for rash.  Allergic/Immunologic: Negative for immunocompromised state.  Neurological: Positive for headaches. Negative for syncope and light-headedness.  Hematological: Does not bruise/bleed easily.  Psychiatric/Behavioral: Negative for sleep disturbance. The patient is nervous/anxious.       Allergies  Penicillins; Aspirin; and Tylenol  Home Medications   Prior to Admission medications   Medication Sig Start Date End Date Taking? Authorizing Provider  FLUoxetine (PROZAC) 20 MG capsule Take 20 mg by mouth daily.   Yes Historical Provider, MD  ibuprofen  (ADVIL,MOTRIN) 200 MG tablet Take 600 mg by mouth every 4 (four) hours as needed for mild pain or moderate pain.    Yes Historical Provider, MD   BP 146/82  Pulse 47  Temp(Src) 97.8 F (36.6 C) (Oral)  Resp 16  SpO2 100%  LMP 12/22/2013 Physical Exam  Nursing note and vitals reviewed. Constitutional: She is oriented to person, place, and time. She appears well-developed and well-nourished. She appears distressed.  Awake, alert, nontoxic appearance Thin appearing, anxious and frustrated appearing  HENT:  Head: Normocephalic and atraumatic.  Right Ear: Tympanic membrane, external ear and ear canal normal.  Left Ear: Tympanic membrane, external ear and ear canal normal.  Nose: Nose normal. No epistaxis. Right sinus exhibits no maxillary sinus tenderness and no frontal sinus tenderness. Left sinus exhibits no maxillary sinus tenderness and no frontal sinus tenderness.  Mouth/Throat: Uvula is midline, oropharynx is clear and moist and mucous membranes are normal. Mucous membranes are not pale and not cyanotic. No oropharyngeal exudate, posterior oropharyngeal edema, posterior oropharyngeal erythema or tonsillar abscesses.  Eyes: Conjunctivae and EOM are normal. Pupils are equal, round, and reactive to light. No scleral icterus.  No horizontal, vertical or rotational nystagmus  Neck: Normal range of motion and full passive range of motion without pain. Neck supple.  Full active and passive ROM without pain No midline or paraspinal tenderness No nuchal rigidity or meningeal signs  Cardiovascular: Normal rate, regular rhythm, normal heart sounds and intact distal pulses.   No murmur heard. Pulmonary/Chest: Effort normal and breath sounds normal. No stridor. No respiratory distress. She has no wheezes. She has no rales.  Clear and equal breath sounds  Abdominal: Soft. Bowel sounds are normal. She exhibits no distension and no mass. There is no tenderness. There is no rebound and no guarding.   Abd soft and nontender  Musculoskeletal: Normal range of motion. She exhibits no edema.  Lymphadenopathy:    She has no cervical adenopathy.  Neurological: She is alert and oriented to person, place, and time. She has normal reflexes. No cranial nerve deficit. She exhibits normal muscle tone. Coordination normal.  Mental Status:  Alert, oriented, thought content appropriate. Speech fluent without evidence of aphasia. Able to follow 2 step commands without difficulty.  Cranial Nerves:  II:  Peripheral visual fields grossly normal, pupils equal, round, reactive to light III,IV, VI: ptosis not present, extra-ocular motions intact bilaterally  V,VII: smile symmetric, facial light touch sensation equal VIII: hearing grossly normal bilaterally  IX,X: gag reflex present  XI: bilateral shoulder shrug equal and strong XII: midline tongue extension  Motor:  5/5 in upper and lower extremities bilaterally including strong and equal grip strength and dorsiflexion/plantar flexion Sensory: Pinprick and light touch normal in all extremities.  Deep Tendon Reflexes: 2+ and symmetric  Cerebellar: normal finger-to-nose with bilateral upper extremities Gait: normal gait and balance CV: distal pulses palpable throughout   Skin: Skin is warm and dry. No rash noted. She is not diaphoretic. No erythema.  Psychiatric: She has a normal mood and affect.  Her behavior is normal. Judgment and thought content normal.    ED Course  Procedures (including critical care time) Labs Review Labs Reviewed  CBC WITH DIFFERENTIAL - Abnormal; Notable for the following:    WBC 3.5 (*)    Hemoglobin 11.8 (*)    HCT 35.8 (*)    RDW 16.0 (*)    Platelets 105 (*)    All other components within normal limits  COMPREHENSIVE METABOLIC PANEL - Abnormal; Notable for the following:    Potassium 3.4 (*)    Glucose, Bld 146 (*)    Albumin 3.4 (*)    All other components within normal limits  I-STAT TROPOININ, ED  Rosezena SensorI-STAT  TROPOININ, ED    Imaging Review Dg Chest 2 View  12/22/2013   CLINICAL DATA:  Chest pain, headache, shortness of Breath  EXAM: CHEST  2 VIEW  COMPARISON:  10/29/2011  FINDINGS: Cardiomediastinal silhouette is stable. No acute infiltrate or pleural effusion. No pulmonary edema. Mild degenerative changes mid thoracic spine.  IMPRESSION: No active cardiopulmonary disease.   Electronically Signed   By: Natasha MeadLiviu  Pop M.D.   On: 12/22/2013 13:24     EKG Interpretation   Date/Time:  Wednesday Dec 22 2013 12:28:17 EDT Ventricular Rate:  72 PR Interval:  156 QRS Duration: 88 QT Interval:  398 QTC Calculation: 435 R Axis:   81 Text Interpretation:  Normal sinus rhythm with sinus arrhythmia Anterior  infarct , age undetermined Abnormal ECG Confirmed by Fayrene FearingJAMES  MD, MARK  [phone]([id number]) on 12/22/2013 12:31:50 PM      MDM   Final diagnoses:  Chest pain  Anxiety  HTN (hypertension)   Amber Ramirez presents with c/o chest pain and headache.  His symptoms are always triggered after a fight with one of her family members. She is anxious and agitated in bed as she discusses the stress of her family.    Patient is feeling better at this time and most of her symptoms have resolved however she remained anxious and hypertensive. We'll give Ativan by mouth and reassess. Initial labs with normal EKG, normal troponin and only a mild anemia.  Chest x-ray without active cardiopulmonary disease.  I personally reviewed the imaging tests through PACS system  I reviewed available ER/hospitalization records through the EMR  6:30PM Patient persistently hypertensive. She is now reporting chest pain however for the last hour she and her fianc have been arguing in the room, screaming at each other. At this time the significant other will be escorted to the waiting room and patient will be allowed to rest. She reports no fracture from the initial Ativan, will repeat.  7:31 PM Pt reports resolved chest pain.  She is  sleeping but remained hypertensive. Repeat EKG without ischemic changes. Patient with asymptomatic bradycardia unchanged from previous.    ECG:  Date: 12/22/2013  Rate: 48  Rhythm: sinus bradycardia  QRS Axis: normal  Intervals: normal  ST/T Wave abnormalities: normal  Conduction Disutrbances:none  Narrative Interpretation: Nonischemic, sinus bradycardia, no ST elevations, unchanged 12:28 PM.  Old EKG Reviewed: unchanged    8:35 PM Patient with normalizing blood pressure. Her chest pain remains resolved. She reports she is feeling much better and she is much calmer now.    Chest pain is not likely of cardiac or pulmonary etiology d/t presentation, PERC negative, VSS, no tracheal deviation, no JVD or new murmur, RRR, breath sounds equal bilaterally, initial and repeat EKG without acute abnormalities, negative delta troponin, and negative CXR. Pt has been advised  return to the ED if CP becomes exertional, associated with diaphoresis or nausea, radiates to left jaw/arm, worsens or becomes concerning in any way.   Patient symptoms are likely secondary to anxiety based on patient history and observed interaction with her fianc.  Patient given Ativan with resolution of her anxiety symptoms.  The patient is resting comfortably, in no apparent distress and asymptomatic.  Labs, ECG and vital signs reviewed.  No exophthalmos.  Stress reducing mechanisms discussed including caffeine intake.  Patient has been referred to psychiatric services for follow-up.    It has been determined that no acute conditions requiring further emergency intervention are present at this time. The patient/guardian have been advised of the diagnosis and plan. We have discussed signs and symptoms that warrant return to the ED, such as changes or worsening in symptoms.   Vital signs are stable at discharge.   BP 146/82  Pulse 47  Temp(Src) 97.8 F (36.6 C) (Oral)  Resp 16  SpO2 100%  LMP 12/22/2013  Patient/guardian  has voiced understanding and agreed to follow-up with the PCP or specialist.  Case has been discussed with Dr. Freida Busman who agrees with the above plan to discharge.   Dierdre Forth, PA-C 12/22/13 2044  9:26 PM Pt reports that she is now going to [facility] to sleep since there are "no meds at home" and the fianc reports there is "no cable television at home."  Patient left in stable condition and there is no evidence of ACS or further emergent workup needed at this time.    Dahlia Client Reia Viernes, PA-C 12/22/13 2127

## 2013-12-22 NOTE — ED Notes (Signed)
Patient states she smokes [place] but has not in three days. Patients significant other states she smokes cigarettes non stop all day.

## 2013-12-22 NOTE — ED Provider Notes (Signed)
Medical screening examination/treatment/procedure(s) were performed by non-physician practitioner and as supervising physician I was immediately available for consultation/collaboration.   EKG Interpretation   Date/Time:  Wednesday Dec 22 2013 12:28:17 EDT Ventricular Rate:  72 PR Interval:  156 QRS Duration: 88 QT Interval:  398 QTC Calculation: 435 R Axis:   81 Text Interpretation:  Normal sinus rhythm with sinus arrhythmia Anterior  infarct , age undetermined Abnormal ECG Confirmed by Fayrene FearingJAMES  MD, MARK  [phone]([id number]) on 12/22/2013 12:31:50 PM       Amber BakerAnthony T Tykeem Lanzer, MD 12/22/13 2320

## 2013-12-22 NOTE — ED Notes (Signed)
PA at bedside.

## 2013-12-22 NOTE — ED Notes (Signed)
Patient and significant other keep argueing with each other and patient is unable to finish talking with the PA student or myself. Told her significant other that he will have to go to the waiting room if he can not stop argueing with her.

## 2013-12-22 NOTE — ED Notes (Signed)
Patient a significant other is not wanting to leave. He was laying on the floor in the room wrapped in a blanket wanting to watch TV. Patient states she is going to [facility] so she can get a room to sleep in before she goes home. AD was made aware. Patient was escorted out by RN because she refused to use a wheelchair. No incidents occurred.

## 2013-12-22 NOTE — Discharge Instructions (Signed)
1. Medications: usual home medications 2. Treatment: rest, drink plenty of fluids,  3. Follow Up: Please followup with your primary doctor in 2 days for discussion of your diagnoses and further evaluation after today's visit; if you do not have a primary care doctor use the resource guide provided to find one;    Chest Pain (Nonspecific) Chest pain has many causes. Your pain could be caused by something serious, such as a heart attack or a blood clot in the lungs. It could also be caused by something less serious, such as a chest bruise or a virus. Follow up with your doctor. More lab tests or other studies may be needed to find the cause of your pain. Most of the time, nonspecific chest pain will improve within 2 to 3 days of rest and mild pain medicine. HOME CARE  For chest bruises, you may put ice on the sore area for 15-20 minutes, 03-04 times a day. Do this only if it makes you feel better.  Put ice in a plastic bag.  Place a towel between the skin and the bag.  Rest for the next 2 to 3 days.  Go back to work if the pain improves.  See your doctor if the pain lasts longer than 1 to 2 weeks.  Only take medicine as told by your doctor.  Quit smoking if you smoke. GET HELP RIGHT AWAY IF:   There is more pain or pain that spreads to the arm, neck, jaw, back, or belly (abdomen).  You have shortness of breath.  You cough more than usual or cough up blood.  You have very bad back or belly pain, feel sick to your stomach (nauseous), or throw up (vomit).  You have very bad weakness.  You pass out (faint).  You have a fever. Any of these problems may be serious and may be an emergency. Do not wait to see if the problems will go away. Get medical help right away. Call your local emergency services 911 in [place] Do not drive yourself to the hospital. MAKE SURE YOU:   Understand these instructions.  Will watch this condition.  Will get help right away if you or your child is not  doing well or gets worse. Document Released: 01/15/2008 Document Revised: 10/21/2011 Document Reviewed: 01/15/2008 [facility] Patient Information 2014 [place], [place].     Emergency Department Resource Guide 1) Find a Doctor and Pay Out of Pocket Although you won't have to find out who is covered by your insurance plan, it is a good idea to ask around and get recommendations. You will then need to call the office and see if the doctor you have chosen will accept you as a new patient and what types of options they offer for patients who are self-pay. Some doctors offer discounts or will set up payment plans for their patients who do not have insurance, but you will need to ask so you aren't surprised when you get to your appointment.  2) Contact Your Local Health Department Not all health departments have doctors that can see patients for sick visits, but many do, so it is worth a call to see if yours does. If you don't know where your local health department is, you can check in your phone book. The CDC also has a tool to help you locate your state's health department, and many state websites also have listings of all of their local health departments.  3) Find a Walk-in Clinic If your illness  is not likely to be very severe or complicated, you may want to try a walk in clinic. These are popping up all over the country in pharmacies, drugstores, and shopping centers. They're usually staffed by nurse practitioners or physician assistants that have been trained to treat common illnesses and complaints. They're usually fairly quick and inexpensive. However, if you have serious medical issues or chronic medical problems, these are probably not your best option.  No Primary Care Doctor: - Call Health Connect at  [phone] - they can help you locate a primary care doctor that  accepts your insurance, provides certain services, etc. - Physician Referral Service- [phone]  Chronic Pain  Problems: Organization         Address  Phone   Notes  [facility]  [phone] Patients need to be referred by their primary care doctor.   Medication Assistance: Organization         Address  Phone   Notes  [facility] Medication [facility] [address] [place], [place] [address] [phone] --Must be a resident of [facility] -- Must have NO insurance coverage whatsoever (no Medicaid/ Medicare, etc.) -- The pt. MUST have a primary care doctor that directs their care regularly and follows them in the community   [facility]  [phone]   [employer]  [phone]    Agencies that provide inexpensive medical care: Organization         Address  Phone   Notes  [facility]  [phone]   [facility]    [phone]   [facility] [address] [place], [place] [address] [phone]   [facility] [address] [place] [place] [phone]   Planned Parenthood    [phone]   [facility]    [phone]   [facility]  [address], [place] Phone:  [phone], Fax:  [phone] Hours of Operation:  9 am - 6 pm, M-F.  Also accepts Medicaid/Medicare and self-pay.  [facility]  [address], [place] Phone: [phone], Fax: [phone]. Hours of Operation:  8:30 am - 5:30 pm, M-F.  Also accepts Medicaid and self-pay.  [facility] [place] [address] [place] Phone: [phone]   [facility] [address] [place] [address] Natasha Bence [place], [place] [phone], Ext. [phone] Mondays & Thursdays: 7-9 AM.  First 15 patients are seen on a first come, first serve basis.    Medicaid-accepting [facility] Providers:  Organization         Address  Phone   Notes  [facility] [address], [place] [phone] Also  accepts self-pay patients.  [facility] [address] Laurell Josephs [place], [place]  [phone]   [facility] [address], [place] [phone]   [facility] [address] [place] [phone]   Renaye Rakers [address], [place]   [phone] Only accepts [place] patients after they have their name applied to their card.   Self-Pay (no insurance) in [facility]:  Organization         Address  Phone   Notes  Sickle Cell Patients, [facility] [address] [place], [place] [phone]  [facility] [address] [place], [place] [phone]   [facility] [place]  [address], [place] [phone]   [facility]/Dr. Osei-Bonsu  [address], [place] or [address], [place] [phone] Phone number for both [place] and [place] locations is the same.  [facility] [address] [place] [phone]   [facility] [address], [place] or [address] [phone]   [facility] [address] [place] [phone], phone; [phone], fax Sees patients 1st and 3rd Saturday of every month.  Must not qualify for public or private insurance (i.e. Medicaid, Medicare, [place] Health Choice, Veterans' Benefits)  Household income should be no more than 200% of the poverty level The clinic cannot treat you if you are pregnant or think you are pregnant  Sexually transmitted diseases are not treated at the clinic.    Dental Care: Organization         Address  Phone  Notes  [facility] Department of [facility] [address] [place], [place] [phone] Accepts children up to age 33 who are enrolled in [place] or [place] Health Choice; pregnant  women with a Medicaid card; and children who have applied for Medicaid or [place] Health Choice, but were declined, whose parents can pay a reduced fee at time of service.  [facility] Department of [facility]  [address], [place] [phone] Accepts children up to age 96 who are enrolled in [place] or [place] Health Choice; pregnant women with a Medicaid card; and children who have applied for Medicaid or [place] Health Choice, but were declined, whose parents can pay a reduced fee at time of service.  [facility]  [address] [place], [place] [phone] Patients are seen by appointment only. Walk-ins are not accepted. [facility] will see patients 6 years of age and older. Monday - Tuesday (8am-5pm) Most Wednesdays (8:30-5pm) $30 per visit, cash only  [facility] Adult [facility]  [address], [facility] [phone] Patients are seen by appointment only. Walk-ins are not accepted. [facility] will see patients 50 years of age and older. One Wednesday Evening (Monthly: Volunteer Based).  $30 per visit, cash only  [employer]  [phone] for adults; Children under age 35, call Graduate Pediatric Dentistry at [phone]. Children aged 80-14, please call [phone] to request a pediatric application.  Dental services are provided in all areas of dental care including fillings, crowns and bridges, complete and partial dentures, implants, gum treatment, root canals, and extractions. Preventive care is also provided. Treatment is provided to both adults and children. Patients are selected via a lottery and there is often a waiting list.   [facility] [address] [place]iano [place]an  [phone] [email or web address]   [facility] [address] [place], [place] [phone], Ext. [phone] Second and Fourth Thursday of each month, opens at 6:30 AM; Clinic ends at 9 AM.  Patients are  seen on a first-come first-served basis, and a limited number are seen during each clinic.   [facility]  [address] Ether Griffins [place], [place] [phone]   Eligibility Requirements You must have lived in [place], [place], or [place] counties for at least the  last three months.   You cannot be eligible for state or federal sponsored [place] Cityhealthcare insurance, including CIG[employer]Veterans Administration, [place]icaid, or [employer]icare.   You generally cannot be eligible for healthcare insurance through your employer.    How to apply: Eligibility screenings are held every Tuesday and Wednesday afternoon from 1:00 pm until 4:00 pm. You do not need an appointment for the interview!  [facility] [address], [place], [place] [phone]   [facility]  [phone]   [facility]  [phone]   [facility]  [phone]    Behavioral Health Resources in the Community: Intensive Outpatient Programs Organization         Address  Phone  Notes  [facility] [address], [place], [place] [phone]   [facility] [address], [place], [place] [phone]   [facility] [address], [place], [place]  [phone]   [facility] [address],  [place], [place] [phone] or [phone]   Substance Abuse Resources Organization         Address  Phone  Notes  [facility]  [phone]   [facility]  [phone]   [facility]  [phone]   [facility]  [phone]   Residential & Outpatient Substance Abuse Program  [phone]   Psychological Services Organization         Address  Phone  Notes  [facility]  [phone]- [phone]   [facility]  [phone]- [phone]   [facility] [address], [place] [phone] or [phone]    Mobile Crisis  Teams Organization         Address  Phone  Notes  [facility], Mobile Crisis Care Unit  [phone]   [facility]  [address] [place], [place] [phone]   Doristine LocksSharon DeEsch [address] [place] [phone]    Self-Help/Support Groups Organization         Address  Phone             Notes  [facility] [place] - variety of support groups  [phone] Call for more information  Narcotics Anonymous (NA), [facility] [address], [place]  2 meetings at this location   Statisticianesidential Treatment Programs Organization         Address  Phone  Notes  [facility] [address],    [place]  [phone]   [facility]  [address], [place] [phone], [place], [place] [phone]   [facility] [address], [place] [phone] Admissions: 8am-3pm M-F  [facility] [address]    [place], [place] [phone]   [facility] [address], [place], [place] [phone]   [facility] [address]  [place], [place] [phone]   [facility] - Intensive Outpatient [address], [place], [place] [phone]   [facility] ([facility] [address]  [place], [place] [phone] or [phone]   [facility] ([facility]) [address] [place], [place] [phone] Accepts Medicaid  [facility] [address]  [place] [phone] Substance Abuse/Addiction Treatment   [facility] Resources Organization         Address  Phone  Notes  [facility]  [phone]   Raynelle FanningJulie  Alvan DameBrannon, PhD [address], [address] [place], [place]   [phone] or [phone]   [facility]   [address] [place], [place] [phone]   [facility] [address], [place], [place] [phone]  Insurance/Medicaid/sponsorship through [facility]  [facility] [address]                                    [place], [place] [phone] Therapy/tele-psych/case  [facility] [address]   [place], [place] [phone]    Dr. Lolly MustacheArfeen  [phone]   [facility] [place]  [employer] [facility] 1) [address], [place] 2) [address], [place] 3)  [address], [place] [phone]  [phone]   [facility] Child Abuse Hotline [phone] or [phone] (After Hours)

## 2014-01-19 ENCOUNTER — Emergency Department (HOSPITAL_COMMUNITY): Payer: Medicare Other

## 2014-01-19 ENCOUNTER — Encounter (HOSPITAL_COMMUNITY): Payer: Self-pay | Admitting: Emergency Medicine

## 2014-01-19 DIAGNOSIS — R079 Chest pain, unspecified: Secondary | ICD-10-CM | POA: Diagnosis present

## 2014-01-19 DIAGNOSIS — R011 Cardiac murmur, unspecified: Secondary | ICD-10-CM | POA: Insufficient documentation

## 2014-01-19 DIAGNOSIS — I10 Essential (primary) hypertension: Secondary | ICD-10-CM | POA: Insufficient documentation

## 2014-01-19 DIAGNOSIS — F17200 Nicotine dependence, unspecified, uncomplicated: Secondary | ICD-10-CM | POA: Diagnosis not present

## 2014-01-19 LAB — I-STAT CHEM 8, ED
BUN: 11 mg/dL (ref 6–23)
Calcium, Ion: 1.13 mmol/L (ref 1.12–1.23)
Chloride: 103 mEq/L (ref 96–112)
Creatinine, Ser: 0.8 mg/dL (ref 0.50–1.10)
GLUCOSE: 89 mg/dL (ref 70–99)
HCT: 43 % (ref 36.0–46.0)
HEMOGLOBIN: 14.6 g/dL (ref 12.0–15.0)
Potassium: 4 mEq/L (ref 3.7–5.3)
Sodium: 141 mEq/L (ref 137–147)
TCO2: 25 mmol/L (ref 0–100)

## 2014-01-19 LAB — COMPREHENSIVE METABOLIC PANEL
ALBUMIN: 3.9 g/dL (ref 3.5–5.2)
ALT: 13 U/L (ref 0–35)
AST: 24 U/L (ref 0–37)
Alkaline Phosphatase: 71 U/L (ref 39–117)
BILIRUBIN TOTAL: 0.6 mg/dL (ref 0.3–1.2)
BUN: 11 mg/dL (ref 6–23)
CHLORIDE: 100 meq/L (ref 96–112)
CO2: 26 mEq/L (ref 19–32)
CREATININE: 0.8 mg/dL (ref 0.50–1.10)
Calcium: 8.9 mg/dL (ref 8.4–10.5)
GFR calc non Af Amer: >90 mL/min (ref >90)
GLUCOSE: 88 mg/dL (ref 70–99)
Potassium: 4.2 mEq/L (ref 3.7–5.3)
Sodium: 141 mEq/L (ref 137–147)
Total Protein: 7.9 g/dL (ref 6.0–8.3)

## 2014-01-19 LAB — CBC
HEMATOCRIT: 38.3 % (ref 36.0–46.0)
HEMOGLOBIN: 12.8 g/dL (ref 12.0–15.0)
MCH: 26.8 pg (ref 26.0–34.0)
MCHC: 33.4 g/dL (ref 30.0–36.0)
MCV: 80.1 fL (ref 78.0–100.0)
Platelets: 123 10*3/uL — ABNORMAL LOW (ref 150–400)
RBC: 4.78 MIL/uL (ref 3.87–5.11)
RDW: 16.5 % — ABNORMAL HIGH (ref 11.5–15.5)
WBC: 4.3 10*3/uL (ref 4.0–10.5)

## 2014-01-19 LAB — PRO B NATRIURETIC PEPTIDE: PRO B NATRI PEPTIDE: 142.5 pg/mL — AB (ref 0–125)

## 2014-01-19 LAB — I-STAT TROPONIN, ED: TROPONIN I, POC: 0 ng/mL (ref 0.00–0.08)

## 2014-01-19 NOTE — ED Notes (Signed)
The patient is here to be seen for chest pain that she has been having since April of this year.  The patient denies any symptoms other than SOB, dizziness, vomiting.  She advised me that she was seen in April and the chest pain has not stopped.  She is here because her chest pain has gotten worse and nothing has helped.  She has only taken ibuprofen, advil,  aleve and nothing has helped.

## 2014-01-20 ENCOUNTER — Emergency Department (HOSPITAL_COMMUNITY)
Admission: EM | Admit: 2014-01-20 | Discharge: 2014-01-20 | Discharge status: Against Medical Advice | Payer: Medicare Other | Attending: Emergency Medicine | Admitting: Emergency Medicine

## 2014-03-03 ENCOUNTER — Inpatient Hospital Stay (HOSPITAL_COMMUNITY)
Admission: AD | Admit: 2014-03-03 | Discharge: 2014-03-03 | Discharge status: Against Medical Advice | Payer: Medicare Other | Source: Ambulatory Visit | Attending: Obstetrics & Gynecology | Admitting: Obstetrics & Gynecology

## 2014-03-03 DIAGNOSIS — R109 Unspecified abdominal pain: Secondary | ICD-10-CM | POA: Diagnosis present

## 2014-03-03 LAB — URINE MICROSCOPIC-ADD ON

## 2014-03-03 LAB — URINALYSIS, ROUTINE W REFLEX MICROSCOPIC
BILIRUBIN URINE: NEGATIVE
Glucose, UA: NEGATIVE mg/dL
KETONES UR: NEGATIVE mg/dL
Leukocytes, UA: NEGATIVE
Nitrite: NEGATIVE
PROTEIN: 100 mg/dL — AB
Specific Gravity, Urine: 1.02 (ref 1.005–1.030)
UROBILINOGEN UA: 0.2 mg/dL (ref 0.0–1.0)
pH: 6 (ref 5.0–8.0)

## 2014-03-03 LAB — POCT PREGNANCY, URINE: PREG TEST UR: NEGATIVE

## 2014-03-03 NOTE — MAU Note (Signed)
Patient states she has had abdominal pain on the left side for about 3 1/2 weeks, getting worse. Denies bleeding or discharge, nausea or vomiting.

## 2014-03-03 NOTE — Progress Notes (Signed)
Patient states she must leave and plans to return tomorrow. Apology to patient for wait time and encouraged her to wait to be seen. Patient still states she must leave. Was given a bus pass per patient request. Patient left in satisfactory condition.

## 2014-03-05 ENCOUNTER — Emergency Department (HOSPITAL_COMMUNITY)
Admission: EM | Admit: 2014-03-05 | Discharge: 2014-03-05 | Disposition: A | Discharge status: Home / Self Care | Payer: Medicare Other | Attending: Emergency Medicine | Admitting: Emergency Medicine

## 2014-03-05 ENCOUNTER — Encounter (HOSPITAL_COMMUNITY): Payer: Self-pay | Admitting: Emergency Medicine

## 2014-03-05 ENCOUNTER — Emergency Department (HOSPITAL_COMMUNITY): Payer: Medicare Other

## 2014-03-05 DIAGNOSIS — R011 Cardiac murmur, unspecified: Secondary | ICD-10-CM | POA: Diagnosis not present

## 2014-03-05 DIAGNOSIS — Z880 Allergy status to penicillin: Secondary | ICD-10-CM | POA: Diagnosis not present

## 2014-03-05 DIAGNOSIS — F17200 Nicotine dependence, unspecified, uncomplicated: Secondary | ICD-10-CM | POA: Diagnosis not present

## 2014-03-05 DIAGNOSIS — F319 Bipolar disorder, unspecified: Secondary | ICD-10-CM | POA: Insufficient documentation

## 2014-03-05 DIAGNOSIS — M549 Dorsalgia, unspecified: Secondary | ICD-10-CM | POA: Insufficient documentation

## 2014-03-05 DIAGNOSIS — R1012 Left upper quadrant pain: Secondary | ICD-10-CM | POA: Diagnosis not present

## 2014-03-05 DIAGNOSIS — Z862 Personal history of diseases of the blood and blood-forming organs and certain disorders involving the immune mechanism: Secondary | ICD-10-CM | POA: Insufficient documentation

## 2014-03-05 DIAGNOSIS — Z9851 Tubal ligation status: Secondary | ICD-10-CM | POA: Insufficient documentation

## 2014-03-05 DIAGNOSIS — R109 Unspecified abdominal pain: Secondary | ICD-10-CM

## 2014-03-05 DIAGNOSIS — Z3202 Encounter for pregnancy test, result negative: Secondary | ICD-10-CM | POA: Diagnosis not present

## 2014-03-05 DIAGNOSIS — I10 Essential (primary) hypertension: Secondary | ICD-10-CM | POA: Diagnosis not present

## 2014-03-05 LAB — URINALYSIS, ROUTINE W REFLEX MICROSCOPIC
Bilirubin Urine: NEGATIVE
GLUCOSE, UA: NEGATIVE mg/dL
Hgb urine dipstick: NEGATIVE
KETONES UR: NEGATIVE mg/dL
LEUKOCYTES UA: NEGATIVE
Nitrite: NEGATIVE
PROTEIN: 30 mg/dL — AB
Specific Gravity, Urine: 1.013 (ref 1.005–1.030)
UROBILINOGEN UA: 0.2 mg/dL (ref 0.0–1.0)
pH: 7.5 (ref 5.0–8.0)

## 2014-03-05 LAB — URINE MICROSCOPIC-ADD ON

## 2014-03-05 LAB — POC URINE PREG, ED: PREG TEST UR: NEGATIVE

## 2014-03-05 MED ORDER — OXYCODONE HCL 5 MG PO TABS
5.0000 mg | ORAL_TABLET | Freq: Once | ORAL | Status: AC
  Administered 2014-03-05: 5 mg via ORAL
  Filled 2014-03-05: qty 1

## 2014-03-05 MED ORDER — IBUPROFEN 200 MG PO TABS
400.0000 mg | ORAL_TABLET | Freq: Once | ORAL | Status: AC
  Administered 2014-03-05: 400 mg via ORAL
  Filled 2014-03-05: qty 2

## 2014-03-05 MED ORDER — MORPHINE SULFATE 4 MG/ML IJ SOLN
6.0000 mg | Freq: Once | INTRAMUSCULAR | Status: AC
  Administered 2014-03-05: 6 mg via INTRAMUSCULAR
  Filled 2014-03-05: qty 2

## 2014-03-05 MED ORDER — TRAMADOL HCL 50 MG PO TABS: 50.0000 mg | ORAL_TABLET | Freq: Four times a day (QID) | ORAL | Status: DC | PRN

## 2014-03-05 NOTE — ED Notes (Signed)
Bed: WA04 Expected date:  Expected time:  Means of arrival:  Comments: EMS-back pain 

## 2014-03-05 NOTE — ED Notes (Signed)
Pt feels that there is something wrong with her and [facility]. MD [place]ut informed and spoke with pt and boyfriend at bedside. Gave graham crackers and juice prior to d/c.

## 2014-03-05 NOTE — ED Notes (Signed)
Pt c/o L side pain/flank pain x 3 weeks, pt states she used prune juice without relief. Pt states she is passing gas without difficulty. Denies n/v/d.

## 2014-03-05 NOTE — ED Provider Notes (Signed)
CSN: [id number]     Arrival date & time 03/05/14  0555 History   First MD Initiated Contact with Patient 03/05/14 [phone]     Chief Complaint  Patient presents with  . Back Pain     (Consider location/radiation/quality/duration/timing/severity/associated sxs/prior Treatment) HPI Comments: 38 year old female with smoking history, no significant abdominal surgeries, tubal ligation history presents with worsening left upper flank pain for the past 2-3 weeks. Pain initially intermittent now fairly constant. Family history kidney stone but no known kidney stone history. Patient does have dysuria. Patient saw OB/GYN clinic on Wednesday with no known cause of pain.  Patient is a 38 y.o. female presenting with back pain. The history is provided by the patient.  Back Pain Associated symptoms: no abdominal pain, no chest pain, no dysuria, no fever and no headaches     Past Medical History  Diagnosis Date  . Anemia   . Hypertension   . Bipolar 1 disorder   . Depression   . Heart murmur    Past Surgical History  Procedure Laterality Date  . Tubal ligation     Family History  Problem Relation Age of Onset  . Schizophrenia Mother   . Cancer Mother     lung  . Hypertension Mother   . Diabetes Father    History  Substance Use Topics  . Smoking status: Current Every Day Smoker -- 1.00 packs/day for 16 years    Types: Cigars, Cigarettes  . Smokeless tobacco: Never Used     Comment: black and mild-4-5/day  . Alcohol Use: Yes     Comment: rare   OB History   Grav Para Term Preterm Abortions TAB SAB Ect Mult Living   3 3 3       3      Review of Systems  Constitutional: Negative for fever and chills.  HENT: Negative for congestion.   Eyes: Negative for visual disturbance.  Respiratory: Negative for shortness of breath.   Cardiovascular: Negative for chest pain.  Gastrointestinal: Negative for vomiting and abdominal pain.  Genitourinary: Positive for flank pain. Negative for dysuria.   Musculoskeletal: Positive for back pain. Negative for neck pain and neck stiffness.  Skin: Negative for rash.  Neurological: Negative for light-headedness and headaches.      Allergies  Penicillins; Aspirin; and Tylenol  Home Medications   Prior to Admission medications   Medication Sig Start Date End Date Taking? Authorizing Provider  ibuprofen (ADVIL,MOTRIN) 200 MG tablet Take 600 mg by mouth every 4 (four) hours as needed for mild pain or moderate pain.    Yes Historical Provider, MD  QUEtiapine Fumarate (SEROQUEL XR) 150 MG 24 hr tablet Take 150 mg by mouth at bedtime.   Yes Historical Provider, MD   BP 147/100  Pulse 66  Temp(Src) 97.4 F (36.3 C) (Oral)  Resp 18  Ht 5\' 1"  (1.549 m)  Wt 93 lb (42.185 kg)  BMI 17.58 kg/m2  SpO2 100%  LMP 02/15/2014 Physical Exam  Nursing note and vitals reviewed. Constitutional: She is oriented to person, place, and time. She appears well-developed and well-nourished.  HENT:  Head: Normocephalic and atraumatic.  Eyes: Conjunctivae are normal. Right eye exhibits no discharge. Left eye exhibits no discharge.  Neck: Normal range of motion. Neck supple. No tracheal deviation present.  Cardiovascular: Normal rate and regular rhythm.   Pulmonary/Chest: Effort normal.  Abdominal: Soft. She exhibits no distension. There is tenderness (mild left mid flank). There is no guarding.  Musculoskeletal: She exhibits tenderness (mild  left mid flank). She exhibits no edema.  Neurological: She is alert and oriented to person, place, and time.  Skin: Skin is warm. No rash noted.  Psychiatric: She has a normal mood and affect.    ED Course  Procedures (including critical care time) Labs Review Labs Reviewed  URINALYSIS, ROUTINE W REFLEX MICROSCOPIC - Abnormal; Notable for the following:    Protein, ur 30 (*)    All other components within normal limits  URINE MICROSCOPIC-ADD ON  POC URINE PREG, ED    Imaging Review Ct Abdomen Pelvis Wo  Contrast  03/05/2014   CLINICAL DATA:  Left flank pain  EXAM: CT ABDOMEN AND PELVIS WITHOUT CONTRAST  TECHNIQUE: Multidetector CT imaging of the abdomen and pelvis was performed following the standard protocol without IV contrast.  COMPARISON:  None.  FINDINGS: The lung bases are clear.  The unenhanced appearance of the liver and spleen are unremarkable. The gallbladder is grossly normal. No common bowel duct dilatation. The pancreas is grossly normal. The adrenal glands and kidneys are unremarkable. No renal calculi or hydronephrosis. The aorta is normal in caliber. No mesenteric or retroperitoneal mass. The stomach, duodenum, small bowel and colon are grossly normal without oral contrast. No obvious inflammatory changes, mass lesions or obstructive findings. Moderate stool throughout the colon may suggest constipation.  The uterus and ovaries are grossly normal. The bladder is decompressed. No pelvic mass or adenopathy. No inguinal mass or adenopathy. The bony structures are intact. There are age advanced hip joint degenerative changes bilaterally.  IMPRESSION: Unremarkable noncontrast CT examination of the abdomen/pelvis. No renal or obstructing ureteral calculi are identified.   Electronically Signed   By: Loralie Champagne M.D.   On: 03/05/2014 08:25     EKG Interpretation None      MDM   Final diagnoses:  Abdominal pain, unspecified abdominal location   Patient with worsening left flank pain for 3 weeks, overall well-appearing and minimal medical history. Plan for urinalysis and CT stone study for further delineation. Pain meds ordered.  Pain controlled in ER. CT scan unremarkable. Outpatient followup discussed.  Results and differential diagnosis were discussed with the patient/parent/guardian. Close follow up outpatient was discussed, comfortable with the plan.   Medications  morphine 4 MG/ML injection 6 mg (6 mg Intramuscular Given 03/05/14 0703)  oxyCODONE (Oxy IR/ROXICODONE) immediate  release tablet 5 mg (5 mg Oral Given 03/05/14 0858)  ibuprofen (ADVIL,MOTRIN) tablet 400 mg (400 mg Oral Given 03/05/14 0859)    Filed Vitals:   03/05/14 0602  BP: 147/100  Pulse: 66  Temp: 97.4 F (36.3 C)  TempSrc: Oral  Resp: 18  Height: 5\' 1"  (1.549 m)  Weight: 93 lb (42.185 kg)  SpO2: 100%         Enid Skeens, MD 03/06/14 [phone]

## 2014-03-05 NOTE — ED Notes (Signed)
Pt medicated for pain per MD order Pt states she had her boyfriend at bedside put an adult diaper on her from the cabinet to catch urine.

## 2014-03-05 NOTE — Discharge Instructions (Signed)
Abdominal Pain, Women °Abdominal (stomach, pelvic, or belly) pain can be caused by many things. It is important to tell your doctor: °· The location of the pain. °· Does it come and go or is it present all the time? °· Are there things that start the pain (eating certain foods, exercise)? °· Are there other symptoms associated with the pain (fever, nausea, vomiting, diarrhea)? °All of this is helpful to know when trying to find the cause of the pain. °CAUSES  °· Stomach: virus or bacteria infection, or ulcer. °· Intestine: appendicitis (inflamed appendix), regional ileitis (Crohn's disease), ulcerative colitis (inflamed colon), irritable bowel syndrome, diverticulitis (inflamed diverticulum of the colon), or cancer of the stomach or intestine. °· Gallbladder disease or stones in the gallbladder. °· Kidney disease, kidney stones, or infection. °· Pancreas infection or cancer. °· Fibromyalgia (pain disorder). °· Diseases of the female organs: °¨ Uterus: fibroid (non-cancerous) tumors or infection. °¨ Fallopian tubes: infection or tubal pregnancy. °¨ Ovary: cysts or tumors. °¨ Pelvic adhesions (scar tissue). °¨ Endometriosis (uterus lining tissue growing in the pelvis and on the pelvic organs). °¨ Pelvic congestion syndrome (female organs filling up with blood just before the menstrual period). °¨ Pain with the menstrual period. °¨ Pain with ovulation (producing an egg). °¨ Pain with an IUD (intrauterine device, birth control) in the uterus. °¨ Cancer of the female organs. °· Functional pain (pain not caused by a disease, may improve without treatment). °· Psychological pain. °· Depression. °DIAGNOSIS  °Your doctor will decide the seriousness of your pain by doing an examination. °· Blood tests. °· X-rays. °· Ultrasound. °· CT scan (computed tomography, special type of X-ray). °· MRI (magnetic resonance imaging). °· Cultures, for infection. °· Barium enema (dye inserted in the large intestine, to better view it with  X-rays). °· Colonoscopy (looking in intestine with a lighted tube). °· Laparoscopy (minor surgery, looking in abdomen with a lighted tube). °· Major abdominal exploratory surgery (looking in abdomen with a large incision). °TREATMENT  °The treatment will depend on the cause of the pain.  °· Many cases can be observed and treated at home. °· Over-the-counter medicines recommended by your caregiver. °· Prescription medicine. °· Antibiotics, for infection. °· Birth control pills, for painful periods or for ovulation pain. °· Hormone treatment, for endometriosis. °· Nerve blocking injections. °· Physical therapy. °· Antidepressants. °· Counseling with a psychologist or psychiatrist. °· Minor or major surgery. °HOME CARE INSTRUCTIONS  °· Do not take laxatives, unless directed by your caregiver. °· Take over-the-counter pain medicine only if ordered by your caregiver. Do not take aspirin because it can cause an upset stomach or bleeding. °· Try a clear liquid diet (broth or water) as ordered by your caregiver. Slowly move to a bland diet, as tolerated, if the pain is related to the stomach or intestine. °· Have a thermometer and take your temperature several times a day, and record it. °· Bed rest and sleep, if it helps the pain. °· Avoid sexual intercourse, if it causes pain. °· Avoid stressful situations. °· Keep your follow-up appointments and tests, as your caregiver orders. °· If the pain does not go away with medicine or surgery, you may try: °¨ Acupuncture. °¨ Relaxation exercises (yoga, meditation). °¨ Group therapy. °¨ Counseling. °SEEK MEDICAL CARE IF:  °· You notice certain foods cause stomach pain. °· Your home care treatment is not helping your pain. °· You need stronger pain medicine. °· You want your IUD removed. °· You feel faint or   lightheaded. °· You develop nausea and vomiting. °· You develop a rash. °· You are having side effects or an allergy to your medicine. °SEEK IMMEDIATE MEDICAL CARE IF:  °· Your  pain does not go away or gets worse. °· You have a fever. °· Your pain is felt only in portions of the abdomen. The right side could possibly be appendicitis. The left lower portion of the abdomen could be colitis or diverticulitis. °· You are passing blood in your stools (bright red or black tarry stools, with or without vomiting). °· You have blood in your urine. °· You develop chills, with or without a fever. °· You pass out. °MAKE SURE YOU:  °· Understand these instructions. °· Will watch your condition. °· Will get help right away if you are not doing well or get worse. °Document Released: 05/26/2007 Document Revised: 12/13/2013 Document Reviewed: 06/15/2009 °[employer]® Patient Information ©2015 [employer]. This information is not intended to replace advice given to you by your health care provider. Make sure you discuss any questions you have with your health care provider. ° °

## 2014-03-05 NOTE — ED Notes (Addendum)
Pt presents with L flank/side pain x 2-3 weeks. Pt used prune juice to "clean myself out" pain no better. +flatulance, denies fever, denies n/v/d.  Pt states she does not feel like she is emptying her bladder Pt feels this might be related to a spider bite 3 years ago Pt concerned she has lost weight recently, reports weighing 103 several months ago.  Pt provided urine sample.  Pt requesting we check her Iron, as it might the cause of her pain

## 2014-06-13 ENCOUNTER — Encounter (HOSPITAL_COMMUNITY): Payer: Self-pay | Admitting: Emergency Medicine

## 2014-11-08 ENCOUNTER — Emergency Department (HOSPITAL_COMMUNITY): Payer: Medicare Other

## 2014-11-08 ENCOUNTER — Encounter (HOSPITAL_COMMUNITY): Payer: Self-pay | Admitting: *Deleted

## 2014-11-08 ENCOUNTER — Emergency Department (HOSPITAL_COMMUNITY)
Admission: EM | Admit: 2014-11-08 | Discharge: 2014-11-09 | Disposition: A | Discharge status: Other Acute Inpatient Hospital | Payer: Medicare Other | Attending: Emergency Medicine | Admitting: Emergency Medicine

## 2014-11-08 DIAGNOSIS — F29 Unspecified psychosis not due to a substance or known physiological condition: Secondary | ICD-10-CM | POA: Insufficient documentation

## 2014-11-08 DIAGNOSIS — R51 Headache: Secondary | ICD-10-CM | POA: Diagnosis not present

## 2014-11-08 DIAGNOSIS — Z720 Tobacco use: Secondary | ICD-10-CM | POA: Diagnosis not present

## 2014-11-08 DIAGNOSIS — F23 Brief psychotic disorder: Secondary | ICD-10-CM | POA: Diagnosis present

## 2014-11-08 DIAGNOSIS — R011 Cardiac murmur, unspecified: Secondary | ICD-10-CM | POA: Diagnosis not present

## 2014-11-08 DIAGNOSIS — I10 Essential (primary) hypertension: Secondary | ICD-10-CM | POA: Insufficient documentation

## 2014-11-08 DIAGNOSIS — R0602 Shortness of breath: Secondary | ICD-10-CM | POA: Diagnosis not present

## 2014-11-08 DIAGNOSIS — R1310 Dysphagia, unspecified: Secondary | ICD-10-CM | POA: Insufficient documentation

## 2014-11-08 DIAGNOSIS — R079 Chest pain, unspecified: Secondary | ICD-10-CM | POA: Diagnosis present

## 2014-11-08 DIAGNOSIS — R519 Headache, unspecified: Secondary | ICD-10-CM

## 2014-11-08 DIAGNOSIS — Z880 Allergy status to penicillin: Secondary | ICD-10-CM | POA: Insufficient documentation

## 2014-11-08 LAB — BASIC METABOLIC PANEL
Anion gap: 3 — ABNORMAL LOW (ref 5–15)
BUN: 10 mg/dL (ref 6–23)
CALCIUM: 8.9 mg/dL (ref 8.4–10.5)
CO2: 30 mmol/L (ref 19–32)
Chloride: 105 mmol/L (ref 96–112)
Creatinine, Ser: 0.91 mg/dL (ref 0.50–1.10)
GFR calc Af Amer: >90 mL/min (ref >90)
GFR calc non Af Amer: 78 mL/min — ABNORMAL LOW (ref >90)
Glucose, Bld: 90 mg/dL (ref 70–99)
Potassium: 4 mmol/L (ref 3.5–5.1)
SODIUM: 138 mmol/L (ref 135–145)

## 2014-11-08 LAB — CBC
HCT: 35.8 % — ABNORMAL LOW (ref 36.0–46.0)
Hemoglobin: 12 g/dL (ref 12.0–15.0)
MCH: 28.2 pg (ref 26.0–34.0)
MCHC: 33.5 g/dL (ref 30.0–36.0)
MCV: 84 fL (ref 78.0–100.0)
Platelets: 134 10*3/uL — ABNORMAL LOW (ref 150–400)
RBC: 4.26 MIL/uL (ref 3.87–5.11)
RDW: 14.9 % (ref 11.5–15.5)
WBC: 3.6 10*3/uL — ABNORMAL LOW (ref 4.0–10.5)

## 2014-11-08 LAB — I-STAT TROPONIN, ED: Troponin i, poc: 0 ng/mL (ref 0.00–0.08)

## 2014-11-08 LAB — BRAIN NATRIURETIC PEPTIDE: B Natriuretic Peptide: 21.1 pg/mL (ref 0.0–100.0)

## 2014-11-08 NOTE — ED Notes (Signed)
Nurse attempted to take bp and pt backed away and growled and stared at the ceiling.  Amber Ramirez began to talk to pt and pt stated "that she was not Zahniya right now."  Pt also told nurse that "i can smell your sins."  Amber SpryGail, NP notified.

## 2014-11-08 NOTE — ED Notes (Signed)
TTS in progress 

## 2014-11-08 NOTE — ED Notes (Signed)
Pt reminded to provide urine specimen

## 2014-11-08 NOTE — ED Notes (Signed)
Yelling for nurse.  Headed toward the sound then patient put call bell on.  Requesting the use of a phone.  Phone placed in room.  Patient states I have to call my preacher yall are about to see something you aint ever seen before.  She then points out a red area to the left forearm that had a small scratch on it.  She then states that is a Ambulance persondemon.  Significant other remains at the bedside.  Encouraged to call for assistance as needed.  Bed remains in lowest position with call bell with in reach.

## 2014-11-08 NOTE — Progress Notes (Signed)
Chaplain paged to visit Ms. Weiss, gentleman named Aviva Signsric Baldwin was bedside, reported to be pt's fiance.   Upon introduction, Chaplain sat near the door. Pt said "Why did you call that priest in here!" Pt jumped at me and attempted to spit on me unsuccessfully. I immediately went to the doorway.   Mr. Cathlean CowerBaldwin said "Please bear with her, she been doing this for a minute, she's not a threat."  Pt yelled for Mr. Cathlean CowerBaldwin to to get out of the room.  Pt expressed to Chaplain that she "needed prayer because of the demons" and asked that I return.   Pt fluctuated giving me looks that expressed disgust and ill will then flipped to desire prayer in tears.   Pt walked fiance to waiting area.  Mr. Cathlean CowerBaldwin expressed that she had a very hard childhood and has medications from "[facility]" yet not sure if she takes them. He expressed a desire for her to eat healthily and take care of herself to which he attests she does not.   Mr. Cathlean CowerBaldwin states that both of her parents are deceased.   Mr. Cathlean CowerBaldwin asked that he be contacted in the event of her discharge but will return tomorrow to check on her.   Info as follows: [phone] cell.   Chaplain returned and pt was sleeping.   Chaplain signing off  Vickii PennaBrown, Morelia Cassells J, Chaplain 11/08/2014 6:03 PM

## 2014-11-08 NOTE — ED Provider Notes (Signed)
CSN: [id number]     Arrival date & time 11/08/14  1310 History   None    Chief Complaint  Patient presents with  . Chest Pain  . Shortness of Breath     (Consider location/radiation/quality/duration/timing/severity/associated sxs/prior Treatment) HPI Comments: Patient presents with a number of complaints today.  Intermittent global headache,  two  Bony masses that she noticed March 14 that have not changed on her anterior chest wall, difficulty swallowing since last November.  She has not seen her primary care physician since she started having these symptoms.  She states she used to be a smoker.  Is there a concern that she may have cancer  Patient is a 39 y.o. female presenting with chest pain and shortness of breath. The history is provided by the patient.  Chest Pain Pain location:  Unable to specify Pain quality comment:  2  Bony masses Pain radiates to:  Does not radiate Pain radiates to the back: no   Pain severity:  Mild Onset quality:  Unable to specify Timing:  Constant Progression:  Unchanged Chronicity:  New Relieved by:  Nothing Worsened by:  Nothing tried Ineffective treatments:  None tried Associated symptoms: dysphagia, headache and shortness of breath   Associated symptoms: no abdominal pain, no fever and no palpitations   Shortness of Breath Associated symptoms: chest pain and headaches   Associated symptoms: no abdominal pain and no fever     Past Medical History  Diagnosis Date  . Anemia   . Hypertension   . Bipolar 1 disorder   . Depression   . Heart murmur    Past Surgical History  Procedure Laterality Date  . Tubal ligation     Family History  Problem Relation Age of Onset  . Schizophrenia Mother   . Cancer Mother     lung  . Hypertension Mother   . Diabetes Father    History  Substance Use Topics  . Smoking status: Current Every Day Smoker -- 1.00 packs/day for 16 years    Types: Cigars, Cigarettes  . Smokeless tobacco: Never Used   Comment: black and mild-4-5/day  . Alcohol Use: Yes     Comment: rare   OB History    Gravida Para Term Preterm AB TAB SAB Ectopic Multiple Living   Review of Systems  Constitutional: Negative for fever and chills.  HENT: Positive for trouble swallowing. Negative for facial swelling, postnasal drip and rhinorrhea.   Respiratory: Positive for shortness of breath.   Cardiovascular: Positive for chest pain. Negative for palpitations and leg swelling.  Gastrointestinal: Negative for abdominal pain.  Neurological: Positive for headaches.  All other systems reviewed and are negative.     Allergies  Penicillins; Aspirin; and Tylenol  Home Medications   Prior to Admission medications   Medication Sig Start Date End Date Taking? Authorizing Provider  ibuprofen (ADVIL,MOTRIN) 200 MG tablet Take 600 mg by mouth every 4 (four) hours as needed for mild pain or moderate pain.    Yes Historical Provider, MD   BP 188/84 mmHg  Pulse 59  Temp(Src) 98.7 F (37.1 C) (Oral)  Resp 16  Ht  (1.549 m)  SpO2 100%  LMP 10/22/2014 Physical Exam  Constitutional: She is oriented to person, place, and time. She appears well-developed and well-nourished.  HENT:  Head: Normocephalic.  Right Ear: External ear normal.  Left Ear: External ear normal.  Mouth/Throat: Oropharynx is  clear and moist.  Eyes: Pupils are equal, round, and reactive to light.  Neck: Normal range of motion. No tracheal deviation present.  Cardiovascular: Normal rate and regular rhythm.   Pulmonary/Chest: Effort normal and breath sounds normal.    Abdominal: Soft. Bowel sounds are normal.  Musculoskeletal: Normal range of motion.  Neurological: She is alert and oriented to person, place, and time.  Skin: Skin is warm and dry.  Nursing note and vitals reviewed.   ED Course  Procedures (including critical care time) Labs Review Labs Reviewed  CBC - Abnormal; Notable for the following:    WBC 3.6  (*)    HCT 35.8 (*)    Platelets 134 (*)    All other components within normal limits  BASIC METABOLIC PANEL - Abnormal; Notable for the following:    GFR calc non Af Amer 78 (*)    Anion gap 3 (*)    All other components within normal limits  URINE RAPID DRUG SCREEN (HOSP PERFORMED) - Abnormal; Notable for the following:    Tetrahydrocannabinol POSITIVE (*)    All other components within normal limits  BRAIN NATRIURETIC PEPTIDE  I-STAT TROPOININ, ED    Imaging Review Dg Chest 2 View  11/08/2014   CLINICAL DATA:  Chest pain and difficulty breathing  EXAM: CHEST  2 VIEW  COMPARISON:  January 19, 2014  FINDINGS: Lungs are clear. Heart size and pulmonary vascularity are normal. No adenopathy. No pneumothorax. No bone lesions.  IMPRESSION: No edema or consolidation.   Electronically Signed   By: Bretta BangWilliam  Woodruff III M.D.   On: 11/08/2014 14:23   Ct Head Wo Contrast  11/08/2014   CLINICAL DATA:  Headache, chest pain  EXAM: CT HEAD WITHOUT CONTRAST  TECHNIQUE: Contiguous axial images were obtained from the base of the skull through the vertex without intravenous contrast.  COMPARISON:  05/19/2007  FINDINGS: No skull fracture is noted. Paranasal sinuses and mastoid air cells are unremarkable. No intracranial hemorrhage, mass effect or midline shift.  No acute infarction. No mass lesion is noted on this unenhanced scan. No hydrocephalus. The gray and white-matter differentiation is preserved.  IMPRESSION: No acute intracranial abnormality.   Electronically Signed   By: Natasha MeadLiviu  Pop M.D.   On: 11/08/2014 22:48     EKG Interpretation None     patient now stating that she is not sure raise that the devil is within her and we need to have an exorcism.  She states she went to a priest downtown, who told her it was gone when she went to a second priest.  He told her it was still there that the only way to get it out is and she couldn't finish the statement.  We'll contact TTS for evaluation Patient has  been evaluated by TTS they're concerned with a low pulse rate, which is in the 50s as well as her acute delusional state they're requesting a head CT, although patient has been delusional in the past.  She denies any head injury. Head CT is normal. 7.  Edition of previous charts a previous EKG shows that patient has had long-standing bradycardia in the high 40s to 70, heart rate without symptoms MDM   Final diagnoses:  Headache  Psychosis, unspecified psychosis type         Earley FavorGail Sael Furches, NP 11/09/14 1956  Linwood DibblesJon Knapp, MD 11/09/14 2142

## 2014-11-08 NOTE — ED Notes (Signed)
Pt requesting chaplain to pray over her because her "arms are red." Secretary notified to page chaplain to come see pt.
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
## 2014-11-08 NOTE — ED Notes (Signed)
Pt reports recent productive cough, having chest pains and sob. Also reports recently finding two lumps on chest. No acute distress noted at triage.
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
## 2014-11-09 DIAGNOSIS — F23 Brief psychotic disorder: Secondary | ICD-10-CM | POA: Diagnosis present

## 2014-11-09 DIAGNOSIS — R51 Headache: Secondary | ICD-10-CM | POA: Diagnosis not present

## 2014-11-09 LAB — RAPID URINE DRUG SCREEN, HOSP PERFORMED
Amphetamines: NOT DETECTED
BARBITURATES: NOT DETECTED
BENZODIAZEPINES: NOT DETECTED
Cocaine: NOT DETECTED
Opiates: NOT DETECTED
Tetrahydrocannabinol: POSITIVE — AB

## 2014-11-09 NOTE — ED Notes (Signed)
Pt aware of plan of care to transport to [facility], pt calling family & boyfriend to update them on the plan of care

## 2014-11-09 NOTE — ED Notes (Signed)
Rounded on pt, informed her of plan to be seen by psychiatry in AM. Pt concerned that she is "going to be locked up." Pt assured that she will not be locked up. Pt concerned that she has to work in the morning. Informed of POD C routines and educated on unit rules i.e. wanding, paper scrubs. Pt agitated, anxious, requiring very simple explanations of procedures and constant reassurance that she is not being kept prisoner.

## 2014-11-09 NOTE — ED Notes (Signed)
Rancour, MD notified to speak with pt d/t her report of feeling spirits, Rancour, MD at bedside speaking with pt

## 2014-11-09 NOTE — ED Notes (Signed)
[place] [employer].   Delivering IVC papers to patient.   Patient currently in restroom.

## 2014-11-09 NOTE — ED Notes (Signed)
IVC papers completed & signed by Manus Gunningancour, MD

## 2014-11-09 NOTE — ED Notes (Signed)
Provided meal for patient at request of significant other. Entered room, pt is anxious, asked if she is "being locked up." Assured pt that she is in facility at her own free will and reserves the right to come and go. Pt becomes calm and cooperative. Pt states she would like to be here, states that she has "demons that she needs to take care of." Pt's significant other insists she eats, is very agitated and verbally agressive, states pt will not help herself and "the man" referring to [facility] staff,wants to put her "away" d/t demons scratching her arms. Pt gestures to significant other and states "I have issues and he's no helping." Pt requests RN find out if she's going to be "locked up." Pt provided therapeutic reassurance by RN.

## 2014-11-09 NOTE — Progress Notes (Signed)
CSW sought placement at the following facilities: [facility]- [facility], [facility], fax referral [facility]- [facility], fax referral [facility]- Cassie, fax referral [facility]- Chris, fax referral [facility], fax referral  [facility]- left voicemail [facility], at capacity [facility] Somalvin, at capacity [facility] informed intake RN to return call  Received call from Minerv[facility] Areolaric [facility] informing pt has been accepted to facility by Dr. Shawnie DapperLopez. Report #: [id number]. CSW informed [facility] RN.   Ilean SkillMeghan Julee Stoll, MSW, LCSWA Clinical Social Work, Disposition Office 11/09/2014 10:28

## 2014-11-09 NOTE — Consult Note (Signed)
Telepsych Consultation   Reason for Consult:  Psychosis, R/O Bipolar Manic Referring Physician: EDP Patient Identification: Amber Ramirez MRN:  [id number] Principal Diagnosis: Acute psychosis Diagnosis:   Patient Active Problem List   Diagnosis Date Noted  . 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 female patient admitted with Psychosis.  HPI:  AA female, 39 years old was evaluated via tele psychiatry at [facility] ER.  She had pressured ans tangential speech.  She is hyper religious  and  Stated"if you do not believe in God please do not talk to me"  Patient stated that she came to the ER for chest pain and cannot understand the reason why she is not allowed to go home.  Patient was speaking most of the time and had to be stopped and redirected by this Probation officer.  Patient reported that she is sees demons and that she is a born again [place]  after her baptism in December.  She reported good sleep and appetite but is worried that she is losing weight.  She admitted that she sees a Social worker at [employer] and they talk about"the present" and not the past.  Patient would not elaborate on what the counseling for therapy is for.  She does not know what her mental illness diagnosis is but admits to taking Seroquel every night for sleep.  She reports a previous hospitalization at age 67 when she tried to OD on her pills.  She was admitted at [facility] at the time according to her.  She denies SI/HI/AVH.  Patient denied illicit drug use or Alcohol use but her UDS came back positive for Marijuana.  She stated that seeing God everyday is part of her being born again [place].  She has been accepted for admission for safety and stabilization  and we will be looking for bed at any hospital with inpatient Psychiatric bed.  We will gather more information from [facility] if possible.  Dr Adaline Sill was informed of  the decision to admit patient.  Patient is already IVC by the doctor at [facility].  HPI Elements:   Location:  Psychosis, R/O Bipolar Manic. Quality:  severe, agitated, verbally aggressive, pressured sopeech.. Severity:  severe. Timing:  acute. Duration:  Chronic mental illness. Context:  Brought in for evaluation of chest pain and mood..  Past Medical History:  Past Medical History  Diagnosis Date  . Anemia   . Hypertension   . Bipolar 1 disorder   . Depression   . Heart murmur     Past Surgical History  Procedure Laterality Date  . Tubal ligation     Family History:  Family History  Problem Relation Age of Onset  . Schizophrenia Mother   . Cancer Mother     lung  . Hypertension Mother   . Diabetes Father    Social History:  History  Alcohol Use  . Yes    Comment: rare     History  Drug Use No    History   Social History  . Marital Status: Single    Spouse Name: N/A  . Number of Children: N/A  . Years of Education: N/A   Social History Main Topics  . Smoking status: Current Every Day Smoker -- 1.00 packs/day for 16 years    Types: Cigars, Cigarettes  . Smokeless tobacco: Never Used     Comment: black and  mild-4-5/day  . Alcohol Use: Yes     Comment: rare  . Drug Use: No  . Sexual Activity: Yes    Birth Control/ Protection: Surgical   Other Topics Concern  . None   Social History Narrative   Additional Social History:    Pain Medications: See PTA medication list Prescriptions: See PTA medication list Over the Counter: See PTA medication list History of alcohol / drug use?: No history of alcohol / drug abuse                     Allergies:   Allergies  Allergen Reactions  . Penicillins Shortness Of Breath  . Aspirin Swelling  . Tylenol [Acetaminophen] Swelling    Labs:  Results for orders placed or performed during the hospital encounter of 11/08/14 (from the past 48 hour(s))  CBC     Status: Abnormal   Collection Time: 11/08/14   1:29 PM  Result Value Ref Range   WBC 3.6 (L) 4.0 - 10.5 K/uL   RBC 4.26 3.87 - 5.11 MIL/uL   Hemoglobin 12.0 12.0 - 15.0 g/dL   HCT 35.8 (L) 36.0 - 46.0 %   MCV 84.0 78.0 - 100.0 fL   MCH 28.2 26.0 - 34.0 pg   MCHC 33.5 30.0 - 36.0 g/dL   RDW 14.9 11.5 - 15.5 %   Platelets 134 (L) 150 - 400 K/uL  Basic metabolic panel     Status: Abnormal   Collection Time: 11/08/14  1:29 PM  Result Value Ref Range   Sodium 138 135 - 145 mmol/L   Potassium 4.0 3.5 - 5.1 mmol/L   Chloride 105 96 - 112 mmol/L   CO2 30 19 - 32 mmol/L   Glucose, Bld 90 70 - 99 mg/dL   BUN 10 6 - 23 mg/dL   Creatinine, Ser 0.91 0.50 - 1.10 mg/dL   Calcium 8.9 8.4 - 10.5 mg/dL   GFR calc non Af Amer 78 (L) >90 mL/min   GFR calc Af Amer >90 >90 mL/min    Comment: (NOTE) The eGFR has been calculated using the CKD EPI equation. This calculation has not been validated in all clinical situations. eGFR's persistently <90 mL/min signify possible Chronic Kidney Disease.    Anion gap 3 (L) 5 - 15  BNP (order ONLY if patient complains of dyspnea/SOB AND you have documented it for THIS visit)     Status: None   Collection Time: 11/08/14  1:29 PM  Result Value Ref Range   B Natriuretic Peptide 21.1 0.0 - 100.0 pg/mL  I-stat troponin, ED (not at [facility])     Status: None   Collection Time: 11/08/14  1:53 PM  Result Value Ref Range   Troponin i, poc 0.00 0.00 - 0.08 ng/mL   Comment 3            Comment: Due to the release kinetics of cTnI, a negative result within the first hours of the onset of symptoms does not rule out myocardial infarction with certainty. If myocardial infarction is still suspected, repeat the test at appropriate intervals.   Drug screen panel, emergency     Status: Abnormal   Collection Time: 11/09/14  3:10 AM  Result Value Ref Range   Opiates NONE DETECTED NONE DETECTED   Cocaine NONE DETECTED NONE DETECTED   Benzodiazepines NONE DETECTED NONE DETECTED   Amphetamines NONE DETECTED NONE DETECTED    Tetrahydrocannabinol POSITIVE (A) NONE DETECTED   Barbiturates NONE DETECTED NONE DETECTED  Comment:        DRUG SCREEN FOR MEDICAL PURPOSES ONLY.  IF CONFIRMATION IS NEEDED FOR ANY PURPOSE, NOTIFY LAB WITHIN 5 DAYS.        LOWEST DETECTABLE LIMITS FOR URINE DRUG SCREEN Drug Class       Cutoff (ng/mL) Amphetamine      1000 Barbiturate      200 Benzodiazepine   062 Tricyclics       376 Opiates          300 Cocaine          300 THC              50     Vitals: Blood pressure 185/91, pulse 52, temperature 97.8 F (36.6 C), temperature source Oral, resp. rate 14, height 5' 1"  (1.549 m), last menstrual period 10/22/2014, SpO2 100 %.  Risk to Self: Suicidal Ideation: No Suicidal Intent: No Is patient at risk for suicide?: No Suicidal Plan?: No Access to Means: No What has been your use of drugs/alcohol within the last 12 months?: Pt denies How many times?: 0 Other Self Harm Risks: Pt may be scratching herself Triggers for Past Attempts: None known Intentional Self Injurious Behavior:  (Pt may be scratching herself.  Pt denies doing it herself.) Risk to Others: Homicidal Ideation: No Thoughts of Harm to Others: No Current Homicidal Intent: No Current Homicidal Plan: No Access to Homicidal Means: No Identified Victim: No one History of harm to others?: Yes Assessment of Violence: In distant past Violent Behavior Description: Fights in past Does patient have access to weapons?: No Criminal Charges Pending?: No Does patient have a court date: No Prior Inpatient Therapy: Prior Inpatient Therapy: Yes Prior Therapy Dates: 2007 Prior Therapy Facilty/Provider(s): [facility] Reason for Treatment: HI Prior Outpatient Therapy: Prior Outpatient Therapy: Yes Prior Therapy Dates: One year Prior Therapy Facilty/Provider(s): [facility] Reason for Treatment: Med management & thera;y  No current facility-administered medications for this encounter.   Current Outpatient Prescriptions   Medication Sig Dispense Refill  . ibuprofen (ADVIL,MOTRIN) 200 MG tablet Take 600 mg by mouth every 4 (four) hours as needed for mild pain or moderate pain.       Musculoskeletal: Strength & Muscle Tone: was assessed in bed  Gait & Station: see above, was assessed in bed Patient leans: see above  Psychiatric Specialty Exam:     Blood pressure 185/91, pulse 52, temperature 97.8 F (36.6 C), temperature source Oral, resp. rate 14, height 5' 1"  (1.549 m), last menstrual period 10/22/2014, SpO2 100 %.There is no weight on file to calculate BMI.  General Appearance: Casual  Eye Contact::  Good  Speech:  Pressured  Volume:  Increased  Mood:  Angry, Anxious and Irritable  Affect:  Congruent and Labile  Thought Process:  Disorganized, Loose and Tangential  Orientation:  Full (Time, Place, and Person)  Thought Content:  Delusions and hyperreligious  Suicidal Thoughts:  No  Homicidal Thoughts:  No  Memory:  Immediate;   Fair Recent;   Fair Remote;   Fair  Judgement:  Impaired  Insight:  Shallow  Psychomotor Activity:  Normal  Concentration:  Fair  Recall:  NA  Fund of Knowledge:Fair  Language: Fair  Akathisia:  NA  Handed:  Right  AIMS (if indicated):     Assets:  Desire for Improvement  ADL's:  Intact  Cognition: Impaired,  Moderate  Sleep:      Medical Decision Making: Review of Psycho-Social Stressors (1) and Review of Medication Regimen &  Side Effects (2)   Treatment Plan Summary: Daily contact with patient to assess and evaluate symptoms and progress in treatment, Medication management and Plan admitted and seek placement  Plan:  Recommend psychiatric Inpatient admission when medically cleared. Disposition: Admitted, seeking bed placement.  Delfin Gant    PMHNP-BC 11/09/2014 9:22 AM

## 2014-11-09 NOTE — ED Provider Notes (Signed)
Patient reports seeing demons and has been delusional all night according to the nurses. She has been intermittent agitated and verbally aggressive. IVC paperwork is completed.  Glynn OctaveStephen Sussan Meter, MD 11/09/14 [phone]

## 2014-11-09 NOTE — ED Notes (Addendum)
pt advised that due to her current status that she would not be able to leave if she chooses not to stay voluntarily, pt is becoming agitated & paranoid & states, "Get out of my room, you are interfering with God's work." [facility] counselor, Carlyn ReichertKristal Frontone at bedside, per counselor the pt called her & asked her to come to the hospital,  The counselor is advised of the units policies, pt & pts visitor provided handout on the unit's guidelines, sitter at bedside, pt states, "if you take my prayer oil I am leaving. You cannot lock someone up like that."

## 2014-11-09 NOTE — ED Notes (Signed)
Pt wanded by security at this time, placed in paper scrubs and belongings collected and placed in dirty utility.

## 2014-12-26 ENCOUNTER — Emergency Department (INDEPENDENT_AMBULATORY_CARE_PROVIDER_SITE_OTHER)
Admission: EM | Admit: 2014-12-26 | Discharge: 2014-12-26 | Disposition: A | Discharge status: Home / Self Care | Payer: Medicare Other | Source: Home / Self Care

## 2014-12-26 ENCOUNTER — Encounter (HOSPITAL_COMMUNITY): Payer: Self-pay

## 2014-12-26 DIAGNOSIS — IMO0001 Reserved for inherently not codable concepts without codable children: Secondary | ICD-10-CM

## 2014-12-26 DIAGNOSIS — R030 Elevated blood-pressure reading, without diagnosis of hypertension: Secondary | ICD-10-CM

## 2014-12-26 DIAGNOSIS — K029 Dental caries, unspecified: Secondary | ICD-10-CM | POA: Diagnosis not present

## 2014-12-26 MED ORDER — IBUPROFEN 600 MG PO TABS: 600.0000 mg | ORAL_TABLET | Freq: Four times a day (QID) | ORAL | Status: DC | PRN

## 2014-12-26 MED ORDER — CLINDAMYCIN HCL 150 MG PO CAPS: 150.0000 mg | ORAL_CAPSULE | Freq: Three times a day (TID) | ORAL | Status: AC

## 2014-12-26 NOTE — ED Notes (Signed)
-  c/o pain left side of mouth

## 2014-12-26 NOTE — ED Provider Notes (Signed)
CSN: [id number]     Arrival date & time 12/26/14  1216 History   None    Chief Complaint  Patient presents with  . Dental Pain   (Consider location/radiation/quality/duration/timing/severity/associated sxs/prior Treatment) HPI Comments: Right lower dental pain since 12/22/2014. Chronic, recurrent issue. States associated gumline and facial swelling over past 24 hours.   Patient is a 39 y.o. female presenting with tooth pain. The history is provided by the patient.  Dental Pain Location:  Lower Quality:  Throbbing Severity:  Moderate Onset quality:  Gradual Duration:  5 days Timing:  Constant Progression:  Worsening Chronicity:  Recurrent Risk factors: lack of dental care, periodontal disease and smoking     Past Medical History  Diagnosis Date  . Anemia   . Hypertension   . Bipolar 1 disorder   . Depression   . Heart murmur    Past Surgical History  Procedure Laterality Date  . Tubal ligation     Family History  Problem Relation Age of Onset  . Schizophrenia Mother   . Cancer Mother     lung  . Hypertension Mother   . Diabetes Father    History  Substance Use Topics  . Smoking status: Current Every Day Smoker -- 1.00 packs/day for 16 years    Types: Cigars, Cigarettes  . Smokeless tobacco: Never Used     Comment: black and mild-4-5/day  . Alcohol Use: Yes     Comment: rare   OB History    Gravida Para Term Preterm AB TAB SAB Ectopic Multiple Living   3 3 3       3      Review of Systems  All other systems reviewed and are negative.   Allergies  Penicillins; Aspirin; and Tylenol  Home Medications   Prior to Admission medications   Medication Sig Start Date End Date Taking? Authorizing Provider  clindamycin (CLEOCIN) 150 MG capsule Take 1 capsule (150 mg total) by mouth 3 (three) times daily. X 7 days 12/26/14   Mathis FareJennifer Lee H Avantika Shere, PA  ibuprofen (ADVIL,MOTRIN) 200 MG tablet Take 600 mg by mouth every 4 (four) hours as needed for mild pain or  moderate pain.     Historical Provider, MD  ibuprofen (ADVIL,MOTRIN) 600 MG tablet Take 1 tablet (600 mg total) by mouth every 6 (six) hours as needed. 12/26/14   Jess BartersJennifer Lee H Cornie Mccomber, PA   BP 179/80 mmHg  Pulse 54  Temp(Src) 98.1 F (36.7 C) (Oral)  Resp 16  SpO2 100% Physical Exam  Constitutional: She is oriented to person, place, and time. She appears well-developed and well-nourished. No distress.  HENT:  Head: Normocephalic and atraumatic.    Right Ear: Hearing, tympanic membrane, external ear and ear canal normal.  Left Ear: Hearing, tympanic membrane, external ear and ear canal normal.  Nose: Nose normal.  Mouth/Throat: Uvula is midline, oropharynx is clear and moist and mucous membranes are normal. No oral lesions. No trismus in the jaw. Dental caries present. No uvula swelling.  Diffuse poor dentition  Outlined area is with moderate tenderness and STS  Eyes: Conjunctivae are normal. No scleral icterus.  Neck: Normal range of motion. Neck supple.  Cardiovascular: Normal rate, regular rhythm and normal heart sounds.   Pulmonary/Chest: Effort normal and breath sounds normal.  Lymphadenopathy:    She has no cervical adenopathy.  Neurological: She is alert and oriented to person, place, and time.  Skin: Skin is warm and dry.  Psychiatric: She has a normal mood and  affect. Her behavior is normal.  Nursing note and vitals reviewed.   ED Course  Procedures (including critical care time) Labs Review Labs Reviewed - No data to display  Imaging Review No results found.   MDM   1. Pain due to dental caries   2. Elevated blood pressure   Clindamycin Ibuprofen  PCP follow up for elevated blood pressure   Ria ClockJennifer Lee H Alphonzo Devera, GeorgiaPA 12/26/14 1455

## 2014-12-26 NOTE — Discharge Instructions (Signed)
Dental Caries °Dental caries (also called tooth decay) is the most common oral disease. It can occur at any age but is more common in children and young adults.  °HOW DENTAL CARIES DEVELOPS  °The process of decay begins when bacteria and foods (particularly sugars and starches) combine in your mouth to produce plaque. Plaque is a substance that sticks to the hard, outer surface of a tooth (enamel). The bacteria in plaque produce acids that attack enamel. These acids may also attack the root surface of a tooth (cementum) if it is exposed. Repeated attacks dissolve these surfaces and create holes in the tooth (cavities). If left untreated, the acids destroy the other layers of the tooth.  °RISK FACTORS °· Frequent sipping of sugary beverages.   °· Frequent snacking on sugary and starchy foods, especially those that easily get stuck in the teeth.   °· Poor oral hygiene.   °· Dry mouth.   °· Substance abuse such as methamphetamine abuse.   °· Broken or poor-fitting dental restorations.   °· Eating disorders.   °· Gastroesophageal reflux disease (GERD).   °· Certain radiation treatments to the head and neck. °SYMPTOMS °In the early stages of dental caries, symptoms are seldom present. Sometimes white, chalky areas may be seen on the enamel or other tooth layers. In later stages, symptoms may include: °· Pits and holes on the enamel. °· Toothache after sweet, hot, or cold foods or drinks are consumed. °· Pain around the tooth. °· Swelling around the tooth. °DIAGNOSIS  °Most of the time, dental caries is detected during a regular dental checkup. A diagnosis is made after a thorough medical and dental history is taken and the surfaces of your teeth are checked for signs of dental caries. Sometimes special instruments, such as lasers, are used to check for dental caries. Dental X-ray exams may be taken so that areas not visible to the eye (such as between the contact areas of the teeth) can be checked for cavities.    °TREATMENT  °If dental caries is in its early stages, it may be reversed with a fluoride treatment or an application of a remineralizing agent at the dental office. Thorough brushing and flossing at home is needed to aid these treatments. If it is in its later stages, treatment depends on the location and extent of tooth destruction:  °· If a small area of the tooth has been destroyed, the destroyed area will be removed and cavities will be filled with a material such as gold, silver amalgam, or composite resin.   °· If a large area of the tooth has been destroyed, the destroyed area will be removed and a cap (crown) will be fitted over the remaining tooth structure.   °· If the center part of the tooth (pulp) is affected, a procedure called a root canal will be needed before a filling or crown can be placed.   °· If most of the tooth has been destroyed, the tooth may need to be pulled (extracted). °HOME CARE INSTRUCTIONS °You can prevent, stop, or reverse dental caries at home by practicing good oral hygiene. Good oral hygiene includes: °· Thoroughly cleaning your teeth at least twice a day with a toothbrush and dental floss.   °· Using a fluoride toothpaste. A fluoride mouth rinse may also be used if recommended by your dentist or health care provider.   °· Restricting the amount of sugary and starchy foods and sugary liquids you consume.   °· Avoiding frequent snacking on these foods and sipping of these liquids.   °· Keeping regular visits with   a dentist for checkups and cleanings. PREVENTION   Practice good oral hygiene.  Consider a dental sealant. A dental sealant is a coating material that is applied by your dentist to the pits and grooves of teeth. The sealant prevents food from being trapped in them. It may protect the teeth for several years.  Ask about fluoride supplements if you live in a community without fluorinated water or with water that has a low fluoride content. Use fluoride supplements  as directed by your dentist or health care provider.  Allow fluoride varnish applications to teeth if directed by your dentist or health care provider. Document Released: 04/20/2002 Document Revised: 12/13/2013 Document Reviewed: 07/31/2012 [facility]aleExitCare Patient Information 2015 [place], [place]. This information is not intended to replace advice given to you by your health care provider. Make sure you discuss any questions you have with your health care provider.  Dental Pain A tooth ache may be caused by cavities (tooth decay). Cavities expose the nerve of the tooth to air and hot or cold temperatures. It may come from an infection or abscess (also called a boil or furuncle) around your tooth. It is also often caused by dental caries (tooth decay). This causes the pain you are having. DIAGNOSIS  Your caregiver can diagnose this problem by exam. TREATMENT   If caused by an infection, it may be treated with medications which kill germs (antibiotics) and pain medications as prescribed by your caregiver. Take medications as directed.  Only take over-the-counter or prescription medicines for pain, discomfort, or fever as directed by your caregiver.  Whether the tooth ache today is caused by infection or dental disease, you should see your dentist as soon as possible for further care. SEEK MEDICAL CARE IF: The exam and treatment you received today has been provided on an emergency basis only. This is not a substitute for complete medical or dental care. If your problem worsens or new problems (symptoms) appear, and you are unable to meet with your dentist, call or return to this location. SEEK IMMEDIATE MEDICAL CARE IF:   You have a fever.  You develop redness and swelling of your face, jaw, or neck.  You are unable to open your mouth.  You have severe pain uncontrolled by pain medicine. MAKE SURE YOU:   Understand these instructions.  Will watch your condition.  Will get help right away if  you are not doing well or get worse. Document Released: 07/29/2005 Document Revised: 10/21/2011 Document Reviewed: 03/16/2008 [facility] Patient Information 2015 [place], [place]. This information is not intended to replace advice given to you by your health care provider. Make sure you discuss any questions you have with your health care provider.  Dental Care and Dentist Visits Dental care supports good overall health. Regular dental visits can also help you avoid dental pain, bleeding, infection, and other more serious health problems in the future. It is important to keep the mouth healthy because diseases in the teeth, gums, and other oral tissues can spread to other areas of the body. Some problems, such as diabetes, heart disease, and pre-term labor have been associated with poor oral health.  See your dentist every 6 months. If you experience emergency problems such as a toothache or broken tooth, go to the dentist right away. If you see your dentist regularly, you may catch problems early. It is easier to be treated for problems in the early stages.  WHAT TO EXPECT AT A DENTIST VISIT  Your dentist will look for many  common oral health problems and recommend proper treatment. At your regular dental visit, you can expect:  Gentle cleaning of the teeth and gums. This includes scraping and polishing. This helps to remove the sticky substance around the teeth and gums (plaque). Plaque forms in the mouth shortly after eating. Over time, plaque hardens on the teeth as tartar. If tartar is not removed regularly, it can cause problems. Cleaning also helps remove stains.  Periodic X-rays. These pictures of the teeth and supporting bone will help your dentist assess the health of your teeth.  Periodic fluoride treatments. Fluoride is a natural mineral shown to help strengthen teeth. Fluoride treatmentinvolves applying a fluoride gel or varnish to the teeth. It is most commonly done in  children.  Examination of the mouth, tongue, jaws, teeth, and gums to look for any oral health problems, such as:  Cavities (dental caries). This is decay on the tooth caused by plaque, sugar, and acid in the mouth. It is best to catch a cavity when it is small.  Inflammation of the gums caused by plaque buildup (gingivitis).  Problems with the mouth or malformed or misaligned teeth.  Oral cancer or other diseases of the soft tissues or jaws. KEEP YOUR TEETH AND GUMS HEALTHY For healthy teeth and gums, follow these general guidelines as well as your dentist's specific advice:  Have your teeth professionally cleaned at the dentist every 6 months.  Brush twice daily with a fluoride toothpaste.  Floss your teeth daily.  Ask your dentist if you need fluoride supplements, treatments, or fluoride toothpaste.  Eat a healthy diet. Reduce foods and drinks with added sugar.  Avoid smoking. TREATMENT FOR ORAL HEALTH PROBLEMS If you have oral health problems, treatment varies depending on the conditions present in your teeth and gums.  Your caregiver will most likely recommend good oral hygiene at each visit.  For cavities, gingivitis, or other oral health disease, your caregiver will perform a procedure to treat the problem. This is typically done at a separate appointment. Sometimes your caregiver will refer you to another dental specialist for specific tooth problems or for surgery. SEEK IMMEDIATE DENTAL CARE IF:  You have pain, bleeding, or soreness in the gum, tooth, jaw, or mouth area.  A permanent tooth becomes loose or separated from the gum socket.  You experience a blow or injury to the mouth or jaw area. Document Released: 04/10/2011 Document Revised: 10/21/2011 Document Reviewed: 04/10/2011 [facility] HospitalExitCare Patient Information 2015 [place]ExitCare, [place]LL[place]. This information is not intended to replace advice given to you by your health care provider. Make sure you discuss any questions you  have with your health care provider.

## 2015-12-13 ENCOUNTER — Encounter (HOSPITAL_COMMUNITY): Payer: Self-pay | Admitting: Emergency Medicine

## 2015-12-13 ENCOUNTER — Emergency Department (HOSPITAL_COMMUNITY)
Admission: EM | Admit: 2015-12-13 | Discharge: 2015-12-14 | Disposition: A | Discharge status: Home / Self Care | Payer: Medicare Other | Attending: Emergency Medicine | Admitting: Emergency Medicine

## 2015-12-13 ENCOUNTER — Emergency Department (HOSPITAL_COMMUNITY): Payer: Medicare Other

## 2015-12-13 DIAGNOSIS — Z880 Allergy status to penicillin: Secondary | ICD-10-CM | POA: Insufficient documentation

## 2015-12-13 DIAGNOSIS — R079 Chest pain, unspecified: Secondary | ICD-10-CM | POA: Diagnosis present

## 2015-12-13 DIAGNOSIS — I10 Essential (primary) hypertension: Secondary | ICD-10-CM | POA: Diagnosis not present

## 2015-12-13 DIAGNOSIS — R011 Cardiac murmur, unspecified: Secondary | ICD-10-CM | POA: Diagnosis not present

## 2015-12-13 DIAGNOSIS — Z8659 Personal history of other mental and behavioral disorders: Secondary | ICD-10-CM | POA: Insufficient documentation

## 2015-12-13 DIAGNOSIS — Z862 Personal history of diseases of the blood and blood-forming organs and certain disorders involving the immune mechanism: Secondary | ICD-10-CM | POA: Diagnosis not present

## 2015-12-13 DIAGNOSIS — F17210 Nicotine dependence, cigarettes, uncomplicated: Secondary | ICD-10-CM | POA: Insufficient documentation

## 2015-12-13 LAB — BASIC METABOLIC PANEL
Anion gap: 12 (ref 5–15)
BUN: 11 mg/dL (ref 6–20)
CHLORIDE: 106 mmol/L (ref 101–111)
CO2: 22 mmol/L (ref 22–32)
CREATININE: 0.68 mg/dL (ref 0.44–1.00)
Calcium: 9.1 mg/dL (ref 8.9–10.3)
GFR calc Af Amer: >60 mL/min (ref >60)
GFR calc non Af Amer: >60 mL/min (ref >60)
GLUCOSE: 95 mg/dL (ref 65–99)
POTASSIUM: 3.6 mmol/L (ref 3.5–5.1)
Sodium: 140 mmol/L (ref 135–145)

## 2015-12-13 LAB — I-STAT TROPONIN, ED: Troponin i, poc: 0.01 ng/mL (ref 0.00–0.08)

## 2015-12-13 LAB — CBC
HEMATOCRIT: 38.2 % (ref 36.0–46.0)
Hemoglobin: 12.8 g/dL (ref 12.0–15.0)
MCH: 26.9 pg (ref 26.0–34.0)
MCHC: 33.5 g/dL (ref 30.0–36.0)
MCV: 80.3 fL (ref 78.0–100.0)
PLATELETS: 121 10*3/uL — AB (ref 150–400)
RBC: 4.76 MIL/uL (ref 3.87–5.11)
RDW: 14.5 % (ref 11.5–15.5)
WBC: 4 10*3/uL (ref 4.0–10.5)

## 2015-12-13 MED ORDER — KETOROLAC TROMETHAMINE 60 MG/2ML IM SOLN
60.0000 mg | Freq: Once | INTRAMUSCULAR | Status: AC
  Administered 2015-12-13: 60 mg via INTRAMUSCULAR
  Filled 2015-12-13: qty 2

## 2015-12-13 NOTE — ED Notes (Signed)
Pt sates she started having Cp since Monday. Pt states she got a bad phone call today and it made the chest pain worse. Pt in NAD at this time.

## 2015-12-13 NOTE — ED Notes (Signed)
MD at bedside. 

## 2015-12-13 NOTE — ED Provider Notes (Signed)
CSN: [id number]     Arrival date & time 12/13/15  1914 History  By signing my name below, I, Amber Ramirez, attest that this documentation has been prepared under the direction and in the presence of Geoffery Lyons, MD. Electronically Signed: [name], ED Scribe. 12/13/2015. 11:15 PM   Chief Complaint  Patient presents with  . Chest Pain    The history is provided by the patient. No language interpreter was used.   Amber Ramirez is a 40 y.o. female with PMHx of HTN who presents to the Emergency Department complaining of intermittent, 10/10 in severity, sharp, left-sided chest pain with onset 4 days ago. She states that the pain has been worse and more constant since she received an upsetting phone call at work today.  Fresh air and relaxation have provided insufficient pain relief PTA.  Associated symptoms include SOB and abdominal pain. Pt denies sweating and nausea. Pt denies history of cardiac disease, DM, or high cholesterol. She is not on any medication for her HTN. Pt is a smoker.   Past Medical History  Diagnosis Date  . Anemia   . Hypertension   . Bipolar 1 disorder (HCC)   . Depression   . Heart murmur    Past Surgical History  Procedure Laterality Date  . Tubal ligation     Family History  Problem Relation Age of Onset  . Schizophrenia Mother   . Cancer Mother     lung  . Hypertension Mother   . Diabetes Father    Social History  Substance Use Topics  . Smoking status: Current Every Day Smoker -- 1.00 packs/day for 16 years    Types: Cigars, Cigarettes  . Smokeless tobacco: Never Used     Comment: black and mild-4-5/day  . Alcohol Use: Yes     Comment: rare   OB History    Gravida Para Term Preterm AB TAB SAB Ectopic Multiple Living   Review of Systems 10 Systems reviewed and all are negative for acute change except as noted in the HPI.  Allergies  Penicillins; Aspirin; and Tylenol  Home Medications   Prior to Admission  medications   Medication Sig Start Date End Date Taking? Authorizing Provider  clindamycin (CLEOCIN) 150 MG capsule Take 1 capsule (150 mg total) by mouth 3 (three) times daily. X 7 days Patient not taking: Reported on 12/13/2015 12/26/14   Mathis Fare Presson, PA  ibuprofen (ADVIL,MOTRIN) 600 MG tablet Take 1 tablet (600 mg total) by mouth every 6 (six) hours as needed. Patient not taking: Reported on 12/13/2015 12/26/14   Jess Barters H Presson, PA   BP 179/110 mmHg  Pulse 58  Temp(Src) 97.4 F (36.3 C) (Oral)  Resp 19  SpO2 100%  LMP 11/29/2015 Physical Exam  Constitutional: She is oriented to person, place, and time. She appears well-developed and well-nourished. No distress.  HENT:  Head: Normocephalic and atraumatic.  Eyes: EOM are normal.  Neck: Normal range of motion.  Cardiovascular: Normal rate, regular rhythm and normal heart sounds.   Pulmonary/Chest: Effort normal and breath sounds normal. She exhibits tenderness.  TTP to the left anterior chest wall. This reproduces her pain.   Abdominal: Soft. She exhibits no distension. There is no tenderness.  Musculoskeletal: Normal range of motion.  Neurological: She is alert and oriented to person, place, and time.  Skin: Skin is warm and dry.  Psychiatric: She has a normal  mood and affect. Judgment normal.  Nursing note and vitals reviewed.   ED Course  Procedures (including critical care time) DIAGNOSTIC STUDIES: Oxygen Saturation is 100% on RA,  normal by my interpretation.    COORDINATION OF CARE: 11:10 PM Discussed treatment plan which includes lab work, CXR, EKG with pt at bedside and pt agreed to plan.  Labs Review Labs Reviewed  CBC - Abnormal; Notable for the following:    Platelets 121 (*)    All other components within normal limits  BASIC METABOLIC PANEL  I-STAT TROPOININ, ED  Rosezena SensorI-STAT TROPOININ, ED    Imaging Review Dg Chest 2 View  12/13/2015  CLINICAL DATA:  Left-sided chest pain beginning 5 days ago.  EXAM: CHEST  2 VIEW COMPARISON:  11/08/2014 FINDINGS: The heart size and mediastinal contours are within normal limits. Both lungs are clear. No evidence of pneumothorax or pleural effusion. The visualized skeletal structures are unremarkable. IMPRESSION: Negative.  No active cardiopulmonary disease. Electronically Signed   By: Myles RosenthalJohn  Stahl M.D.   On: 12/13/2015 21:01   I have personally reviewed and evaluated these images and lab results as part of my medical decision-making.   EKG Interpretation   Date/Time:  Wednesday Dec 13 2015 19:19:49 EDT Ventricular Rate:  76 PR Interval:  144 QRS Duration: 90 QT Interval:  388 QTC Calculation: 436 R Axis:   87 Text Interpretation:  Normal sinus rhythm Right atrial enlargement RSR' or  QR pattern in V1 suggests right ventricular conduction delay Septal  infarct , age undetermined Abnormal ECG No significant change since  11/08/2014 Confirmed by Mikhala Kenan  MD, Rolin Schult ([id number]) on 12/13/2015 11:17:04 PM      MDM   Final diagnoses:  None    Patient presents here with complaints of chest pain intermittently for the past 4-5 days. Her symptoms are atypical for cardiac pain and reproducible with palpation of her chest. The discomfort became worse this evening after she received a distressful phone call. Her EKG is not suggestive of an ischemic process and troponin 2 is negative. I highly doubt a cardiac etiology and strongly suspect this is musculoskeletal or anxiety related. I see no indication for admission or further testing. She has no cardiac risk factors. She will be discharged with instructions to take ibuprofen and follow-up with primary Dr. if not improving.  I personally performed the services described in this documentation, which was scribed in my presence. The recorded information has been reviewed and is accurate.       Geoffery Lyonsouglas Sir Mallis, MD 12/14/15 [id number]

## 2015-12-14 LAB — I-STAT TROPONIN, ED: Troponin i, poc: 0 ng/mL (ref 0.00–0.08)

## 2015-12-14 NOTE — Discharge Instructions (Signed)
Ibuprofen 600 mg 3 times daily for the next 3 days.  Follow-up with your primary Dr. if not improving in the next 3-4 days, and return to the ER if symptoms significantly worsen or change.   Nonspecific Chest Pain  Chest pain can be caused by many different conditions. There is always a chance that your pain could be related to something serious, such as a heart attack or a blood clot in your lungs. Chest pain can also be caused by conditions that are not life-threatening. If you have chest pain, it is very important to follow up with your health care provider. CAUSES  Chest pain can be caused by:  Heartburn.  Pneumonia or bronchitis.  Anxiety or stress.  Inflammation around your heart (pericarditis) or lung (pleuritis or pleurisy).  A blood clot in your lung.  A collapsed lung (pneumothorax). It can develop suddenly on its own (spontaneous pneumothorax) or from trauma to the chest.  Shingles infection (varicella-zoster virus).  Heart attack.  Damage to the bones, muscles, and cartilage that make up your chest wall. This can include:  Bruised bones due to injury.  Strained muscles or cartilage due to frequent or repeated coughing or overwork.  Fracture to one or more ribs.  Sore cartilage due to inflammation (costochondritis). RISK FACTORS  Risk factors for chest pain may include:  Activities that increase your risk for trauma or injury to your chest.  Respiratory infections or conditions that cause frequent coughing.  Medical conditions or overeating that can cause heartburn.  Heart disease or family history of heart disease.  Conditions or health behaviors that increase your risk of developing a blood clot.  Having had chicken pox (varicella zoster). SIGNS AND SYMPTOMS Chest pain can feel like:  Burning or tingling on the surface of your chest or deep in your chest.  Crushing, pressure, aching, or squeezing pain.  Dull or sharp pain that is worse when you  move, cough, or take a deep breath.  Pain that is also felt in your back, neck, shoulder, or arm, or pain that spreads to any of these areas. Your chest pain may come and go, or it may stay constant. DIAGNOSIS Lab tests or other studies may be needed to find the cause of your pain. Your health care provider may have you take a test called an ambulatory ECG (electrocardiogram). An ECG records your heartbeat patterns at the time the test is performed. You may also have other tests, such as:  Transthoracic echocardiogram (TTE). During echocardiography, sound waves are used to create a picture of all of the heart structures and to look at how blood flows through your heart.  Transesophageal echocardiogram (TEE).This is a more advanced imaging test that obtains images from inside your body. It allows your health care provider to see your heart in finer detail.  Cardiac monitoring. This allows your health care provider to monitor your heart rate and rhythm in real time.  Holter monitor. This is a portable device that records your heartbeat and can help to diagnose abnormal heartbeats. It allows your health care provider to track your heart activity for several days, if needed.  Stress tests. These can be done through exercise or by taking medicine that makes your heart beat more quickly.  Blood tests.  Imaging tests. TREATMENT  Your treatment depends on what is causing your chest pain. Treatment may include:  Medicines. These may include:  Acid blockers for heartburn.  Anti-inflammatory medicine.  Pain medicine for inflammatory conditions.  Antibiotic medicine, if an infection is present.  Medicines to dissolve blood clots.  Medicines to treat coronary artery disease.  Supportive care for conditions that do not require medicines. This may include:  Resting.  Applying heat or cold packs to injured areas.  Limiting activities until pain decreases. HOME CARE INSTRUCTIONS  If you  were prescribed an antibiotic medicine, finish it all even if you start to feel better.  Avoid any activities that bring on chest pain.  Do not use any tobacco products, including cigarettes, chewing tobacco, or electronic cigarettes. If you need help quitting, ask your health care provider.  Do not drink alcohol.  Take medicines only as directed by your health care provider.  Keep all follow-up visits as directed by your health care provider. This is important. This includes any further testing if your chest pain does not go away.  If heartburn is the cause for your chest pain, you may be told to keep your head raised (elevated) while sleeping. This reduces the chance that acid will go from your stomach into your esophagus.  Make lifestyle changes as directed by your health care provider. These may include:  Getting regular exercise. Ask your health care provider to suggest some activities that are safe for you.  Eating a heart-healthy diet. A registered dietitian can help you to learn healthy eating options.  Maintaining a healthy weight.  Managing diabetes, if necessary.  Reducing stress. SEEK MEDICAL CARE IF:  Your chest pain does not go away after treatment.  You have a rash with blisters on your chest.  You have a fever. SEEK IMMEDIATE MEDICAL CARE IF:   Your chest pain is worse.  You have an increasing cough, or you cough up blood.  You have severe abdominal pain.  You have severe weakness.  You faint.  You have chills.  You have sudden, unexplained chest discomfort.  You have sudden, unexplained discomfort in your arms, back, neck, or jaw.  You have shortness of breath at any time.  You suddenly start to sweat, or your skin gets clammy.  You feel nauseous or you vomit.  You suddenly feel light-headed or dizzy.  Your heart begins to beat quickly, or it feels like it is skipping beats. These symptoms may represent a serious problem that is an  emergency. Do not wait to see if the symptoms will go away. Get medical help right away. Call your local emergency services (911 in the [place] Do not drive yourself to the hospital.   This information is not intended to replace advice given to you by your health care provider. Make sure you discuss any questions you have with your health care provider.   Document Released: 05/08/2005 Document Revised: 08/19/2014 Document Reviewed: 03/04/2014 Elsevier Interactive Patient Education [employer].

## 2016-02-25 ENCOUNTER — Encounter (HOSPITAL_COMMUNITY): Payer: Self-pay

## 2016-02-25 ENCOUNTER — Emergency Department (HOSPITAL_COMMUNITY)
Admission: EM | Admit: 2016-02-25 | Discharge: 2016-02-25 | Disposition: A | Discharge status: Home / Self Care | Payer: Medicare Other | Attending: Emergency Medicine | Admitting: Emergency Medicine

## 2016-02-25 DIAGNOSIS — R519 Headache, unspecified: Secondary | ICD-10-CM

## 2016-02-25 DIAGNOSIS — I10 Essential (primary) hypertension: Secondary | ICD-10-CM | POA: Diagnosis not present

## 2016-02-25 DIAGNOSIS — F17210 Nicotine dependence, cigarettes, uncomplicated: Secondary | ICD-10-CM | POA: Insufficient documentation

## 2016-02-25 DIAGNOSIS — Z79899 Other long term (current) drug therapy: Secondary | ICD-10-CM | POA: Diagnosis not present

## 2016-02-25 DIAGNOSIS — R51 Headache: Secondary | ICD-10-CM | POA: Insufficient documentation

## 2016-02-25 LAB — BASIC METABOLIC PANEL
ANION GAP: 5 (ref 5–15)
BUN: 15 mg/dL (ref 6–20)
CALCIUM: 9.1 mg/dL (ref 8.9–10.3)
CO2: 27 mmol/L (ref 22–32)
Chloride: 106 mmol/L (ref 101–111)
Creatinine, Ser: 1.19 mg/dL — ABNORMAL HIGH (ref 0.44–1.00)
GFR calc Af Amer: >60 mL/min (ref >60)
GFR calc non Af Amer: 56 mL/min — ABNORMAL LOW (ref >60)
GLUCOSE: 87 mg/dL (ref 65–99)
Potassium: 4 mmol/L (ref 3.5–5.1)
Sodium: 138 mmol/L (ref 135–145)

## 2016-02-25 LAB — I-STAT BETA HCG BLOOD, ED (MC, WL, AP ONLY): I-stat hCG, quantitative: <5 m[IU]/mL (ref <5)

## 2016-02-25 MED ORDER — METOCLOPRAMIDE HCL 5 MG/ML IJ SOLN
10.0000 mg | Freq: Once | INTRAMUSCULAR | Status: AC
  Administered 2016-02-25: 10 mg via INTRAVENOUS
  Filled 2016-02-25: qty 2

## 2016-02-25 MED ORDER — SODIUM CHLORIDE 0.9 % IV BOLUS (SEPSIS)
1000.0000 mL | Freq: Once | INTRAVENOUS | Status: AC
  Administered 2016-02-25: 1000 mL via INTRAVENOUS

## 2016-02-25 MED ORDER — LORAZEPAM 2 MG/ML IJ SOLN
1.0000 mg | Freq: Once | INTRAMUSCULAR | Status: AC
  Administered 2016-02-25: 1 mg via INTRAVENOUS
  Filled 2016-02-25: qty 1

## 2016-02-25 NOTE — Discharge Instructions (Signed)
It was our pleasure to provide your ER care today - we hope that you feel better.  Rest. Drink plenty of fluids.  Follow up with primary care doctor in the next 1-2 days for recheck if symptoms fail to improve/resolve.  Your blood pressure was elevated tonight - follow up with primary care doctor for recheck of blood pressure in the coming week.   Return to ER if worse, new symptoms, fevers, persistent vomiting, worsening or severe headache, other concern.  You were given medication in the ER that causes drowsiness - no driving for the next 4 hours.   General Headache Without Cause A headache is pain or discomfort felt around the head or neck area. The specific cause of a headache may not be found. There are many causes and types of headaches. A few common ones are:  Tension headaches.  Migraine headaches.  Cluster headaches.  Chronic daily headaches. HOME CARE INSTRUCTIONS  Watch your condition for any changes. Take these steps to help with your condition: Managing Pain  Take over-the-counter and prescription medicines only as told by your health care provider.  Lie down in a dark, quiet room when you have a headache.  If directed, apply ice to the head and neck area:  Put ice in a plastic bag.  Place a towel between your skin and the bag.  Leave the ice on for 20 minutes, 2-3 times per day.  Use a heating pad or hot shower to apply heat to the head and neck area as told by your health care provider.  Keep lights dim if bright lights bother you or make your headaches worse. Eating and Drinking  Eat meals on a regular schedule.  Limit alcohol use.  Decrease the amount of caffeine you drink, or stop drinking caffeine. General Instructions  Keep all follow-up visits as told by your health care provider. This is important.  Keep a headache journal to help find out what may trigger your headaches. For example, write down:  What you eat and drink.  How much sleep  you get.  Any change to your diet or medicines.  Try massage or other relaxation techniques.  Limit stress.  Sit up straight, and do not tense your muscles.  Do not use tobacco products, including cigarettes, chewing tobacco, or e-cigarettes. If you need help quitting, ask your health care provider.  Exercise regularly as told by your health care provider.  Sleep on a regular schedule. Get 7-9 hours of sleep, or the amount recommended by your health care provider. SEEK MEDICAL CARE IF:   Your symptoms are not helped by medicine.  You have a headache that is different from the usual headache.  You have nausea or you vomit.  You have a fever. SEEK IMMEDIATE MEDICAL CARE IF:   Your headache becomes severe.  You have repeated vomiting.  You have a stiff neck.  You have a loss of vision.  You have problems with speech.  You have pain in the eye or ear.  You have muscular weakness or loss of muscle control.  You lose your balance or have trouble walking.  You feel faint or pass out.  You have confusion.   This information is not intended to replace advice given to you by your health care provider. Make sure you discuss any questions you have with your health care provider.   Document Released: 07/29/2005 Document Revised: 04/19/2015 Document Reviewed: 11/21/2014 Elsevier Interactive Patient Education 2016 [employer]  Hypertension Hypertension, commonly  called high blood pressure, is when the force of blood pumping through your arteries is too strong. Your arteries are the blood vessels that carry blood from your heart throughout your body. A blood pressure reading consists of a higher number over a lower number, such as 110/72. The higher number (systolic) is the pressure inside your arteries when your heart pumps. The lower number (diastolic) is the pressure inside your arteries when your heart relaxes. Ideally you want your blood pressure below  120/80. Hypertension forces your heart to work harder to pump blood. Your arteries may become narrow or stiff. Having untreated or uncontrolled hypertension can cause heart attack, stroke, kidney disease, and other problems. RISK FACTORS Some risk factors for high blood pressure are controllable. Others are not.  Risk factors you cannot control include:   Race. You may be at higher risk if you are African American.  Age. Risk increases with age.  Gender. Men are at higher risk than women before age 60 years. After age 19, women are at higher risk than men. Risk factors you can control include:  Not getting enough exercise or physical activity.  Being overweight.  Getting too much fat, sugar, calories, or salt in your diet.  Drinking too much alcohol. SIGNS AND SYMPTOMS Hypertension does not usually cause signs or symptoms. Extremely high blood pressure (hypertensive crisis) may cause headache, anxiety, shortness of breath, and nosebleed. DIAGNOSIS To check if you have hypertension, your health care provider will measure your blood pressure while you are seated, with your arm held at the level of your heart. It should be measured at least twice using the same arm. Certain conditions can cause a difference in blood pressure between your right and left arms. A blood pressure reading that is higher than normal on one occasion does not mean that you need treatment. If it is not clear whether you have high blood pressure, you may be asked to return on a different day to have your blood pressure checked again. Or, you may be asked to monitor your blood pressure at home for 1 or more weeks. TREATMENT Treating high blood pressure includes making lifestyle changes and possibly taking medicine. Living a healthy lifestyle can help lower high blood pressure. You may need to change some of your habits. Lifestyle changes may include:  Following the DASH diet. This diet is high in fruits, vegetables, and  whole grains. It is low in salt, red meat, and added sugars.  Keep your sodium intake below 2,300 mg per day.  Getting at least 30-45 minutes of aerobic exercise at least 4 times per week.  Losing weight if necessary.  Not smoking.  Limiting alcoholic beverages.  Learning ways to reduce stress. Your health care provider may prescribe medicine if lifestyle changes are not enough to get your blood pressure under control, and if one of the following is true:  You are 44-92 years of age and your systolic blood pressure is above 140.  You are 18 years of age or older, and your systolic blood pressure is above 150.  Your diastolic blood pressure is above 90.  You have diabetes, and your systolic blood pressure is over 140 or your diastolic blood pressure is over 90.  You have kidney disease and your blood pressure is above 140/90.  You have heart disease and your blood pressure is above 140/90. Your personal target blood pressure may vary depending on your medical conditions, your age, and other factors. HOME CARE INSTRUCTIONS  Have  your blood pressure rechecked as directed by your health care provider.   Take medicines only as directed by your health care provider. Follow the directions carefully. Blood pressure medicines must be taken as prescribed. The medicine does not work as well when you skip doses. Skipping doses also puts you at risk for problems.  Do not smoke.   Monitor your blood pressure at home as directed by your health care provider. SEEK MEDICAL CARE IF:   You think you are having a reaction to medicines taken.  You have recurrent headaches or feel dizzy.  You have swelling in your ankles.  You have trouble with your vision. SEEK IMMEDIATE MEDICAL CARE IF:  You develop a severe headache or confusion.  You have unusual weakness, numbness, or feel faint.  You have severe chest or abdominal pain.  You vomit repeatedly.  You have trouble  breathing. MAKE SURE YOU:   Understand these instructions.  Will watch your condition.  Will get help right away if you are not doing well or get worse.   This information is not intended to replace advice given to you by your health care provider. Make sure you discuss any questions you have with your health care provider.   Document Released: 07/29/2005 Document Revised: 12/13/2014 Document Reviewed: 05/21/2013 Elsevier Interactive Patient Education [employer]

## 2016-02-25 NOTE — ED Notes (Signed)
Patient here with ongoing migraine headache x 3 weeks, states that her head hurts on left side with nausea and vomiting with same. Denies trauma. States has seen her MD with no relief. Alert and oriented.

## 2016-02-25 NOTE — ED Notes (Signed)
Pt is in stable condition upon d/c and ambulates from ED. 

## 2016-02-25 NOTE — ED Provider Notes (Signed)
CSN: [id number]     Arrival date & time 02/25/16  1551 History   First MD Initiated Contact with Patient 02/25/16 2006     Chief Complaint  Patient presents with  . Migraine     (Consider location/radiation/quality/duration/timing/severity/associated sxs/prior Treatment) Patient is a 40 y.o. female presenting with migraines. The history is provided by the patient.  Migraine Associated symptoms include headaches. Pertinent negatives include no chest pain, no abdominal pain and no shortness of breath.  patient c/o intermittent, frontal headaches for the past 2-3 weeks. Headache almost daily. Pain mod-severe, persistent. Headaches/pain is gradual onset. No acute, abrupt or thunderclap type headaches. No eye pain or change in vision. No neck pain or stiffness. Denies sinus drainage or congestion. No fever or chills. Denies any recent head injury, trauma or fall. No syncope. Denies change in speech. No numbness/weakness, or loss of normal functional ability. Nausea. Has had a couple episodes emesis, not bloody or bilious. No diarrhea. No abd pain. lnmp 1-2 weeks ago.       Past Medical History  Diagnosis Date  . Anemia   . Hypertension   . Bipolar 1 disorder (HCC)   . Depression   . Heart murmur    Past Surgical History  Procedure Laterality Date  . Tubal ligation     Family History  Problem Relation Age of Onset  . Schizophrenia Mother   . Cancer Mother     lung  . Hypertension Mother   . Diabetes Father    Social History  Substance Use Topics  . Smoking status: Current Every Day Smoker -- 1.00 packs/day for 16 years    Types: Cigars, Cigarettes  . Smokeless tobacco: Never Used     Comment: black and mild-4-5/day  . Alcohol Use: Yes     Comment: rare   OB History    Gravida Para Term Preterm AB TAB SAB Ectopic Multiple Living   3 3 3       3      Review of Systems  Constitutional: Negative for fever and chills.  HENT: Negative for sore throat.   Eyes: Negative for  pain, redness and visual disturbance.  Respiratory: Negative for cough and shortness of breath.   Cardiovascular: Negative for chest pain and leg swelling.  Gastrointestinal: Positive for nausea and vomiting. Negative for abdominal pain and diarrhea.  Genitourinary: Negative for dysuria and flank pain.  Musculoskeletal: Negative for back pain and neck pain.  Skin: Negative for rash.  Neurological: Positive for headaches. Negative for syncope, weakness and numbness.  Hematological: Does not bruise/bleed easily.  Psychiatric/Behavioral: Negative for confusion.      Allergies  Penicillins; Aspirin; and Tylenol  Home Medications   Prior to Admission medications   Medication Sig Start Date End Date Taking? Authorizing Provider  clindamycin (CLEOCIN) 150 MG capsule Take 1 capsule (150 mg total) by mouth 3 (three) times daily. X 7 days Patient not taking: Reported on 12/13/2015 12/26/14   Mathis FareJennifer Lee H Presson, PA  ibuprofen (ADVIL,MOTRIN) 600 MG tablet Take 1 tablet (600 mg total) by mouth every 6 (six) hours as needed. Patient not taking: Reported on 12/13/2015 12/26/14   Jess BartersJennifer Lee H Presson, PA   BP 173/90 mmHg  Pulse 58  Temp(Src) 97.9 F (36.6 C) (Oral)  Resp 16  Ht 5' (1.524 m)  Wt 42.185 kg  BMI 18.16 kg/m2  SpO2 100% Physical Exam  Constitutional: She is oriented to person, place, and time. She appears well-developed and well-nourished. No distress.  HENT:  Head: Atraumatic.  Nose: Nose normal.  Mouth/Throat: Oropharynx is clear and moist.  No sinus or temporal tenderness.  Eyes: Conjunctivae and EOM are normal. Pupils are equal, round, and reactive to light. No scleral icterus.  Neck: Neck supple. No tracheal deviation present. No thyromegaly present.  No stiffness or rigidity.   Cardiovascular: Normal rate, regular rhythm, normal heart sounds and intact distal pulses.  Exam reveals no gallop and no friction rub.   No murmur heard. Pulmonary/Chest: Effort normal and  breath sounds normal. No respiratory distress.  Abdominal: Soft. Normal appearance and bowel sounds are normal. She exhibits no distension. There is no tenderness.  Genitourinary:  No cva tenderness.  Musculoskeletal: Normal range of motion. She exhibits no edema or tenderness.  Neurological: She is alert and oriented to person, place, and time. No cranial nerve deficit.  Motor intact bilaterally. Steady gait.   Skin: Skin is warm and dry. No rash noted.  Psychiatric:  Anxious.   Nursing note and vitals reviewed.   ED Course  Procedures (including critical care time) Labs Review  Results for orders placed or performed during the hospital encounter of 02/25/16  Basic metabolic panel  Result Value Ref Range   Sodium 138 135 - 145 mmol/L   Potassium 4.0 3.5 - 5.1 mmol/L   Chloride 106 101 - 111 mmol/L   CO2 27 22 - 32 mmol/L   Glucose, Bld 87 65 - 99 mg/dL   BUN 15 6 - 20 mg/dL   Creatinine, Ser 1.61 (H) 0.44 - 1.00 mg/dL   Calcium 9.1 8.9 - 09.6 mg/dL   GFR calc non Af Amer 56 (L) >60 mL/min   GFR calc Af Amer >60 >60 mL/min   Anion gap 5 5 - 15  I-Stat beta hCG blood, ED ([facility], [facility], [facility] only)  Result Value Ref Range   I-stat hCG, quantitative <5.0 <5 mIU/mL   Comment 3               I have personally reviewed and evaluated these lab results as part of my medical decision-making.    MDM   Iv ns bolus.   reglan iv.  Patient very anxious. Reassurance provided, as well as ativan 1 mg iv.  Reviewed nursing notes and prior charts for additional history.   From old records, appears pt has had 2-3 prior head cts to eval c/o headache in past, prior cts neg for acute process.   Ns bolus in ed.  Recheck no headache, no nv.    Patient currently appears stable for d/c.         Cathren Laine, MD 02/25/16 2256

## 2016-02-27 ENCOUNTER — Encounter (HOSPITAL_COMMUNITY): Payer: Self-pay | Admitting: Emergency Medicine

## 2016-02-27 ENCOUNTER — Emergency Department (HOSPITAL_COMMUNITY)
Admission: EM | Admit: 2016-02-27 | Discharge: 2016-02-27 | Disposition: A | Discharge status: Home / Self Care | Payer: Medicare Other | Attending: Emergency Medicine | Admitting: Emergency Medicine

## 2016-02-27 ENCOUNTER — Emergency Department (HOSPITAL_COMMUNITY): Payer: Medicare Other

## 2016-02-27 DIAGNOSIS — R51 Headache: Secondary | ICD-10-CM

## 2016-02-27 DIAGNOSIS — F17210 Nicotine dependence, cigarettes, uncomplicated: Secondary | ICD-10-CM | POA: Insufficient documentation

## 2016-02-27 DIAGNOSIS — I10 Essential (primary) hypertension: Secondary | ICD-10-CM | POA: Diagnosis not present

## 2016-02-27 DIAGNOSIS — R519 Headache, unspecified: Secondary | ICD-10-CM

## 2016-02-27 DIAGNOSIS — G43909 Migraine, unspecified, not intractable, without status migrainosus: Secondary | ICD-10-CM | POA: Diagnosis not present

## 2016-02-27 DIAGNOSIS — F329 Major depressive disorder, single episode, unspecified: Secondary | ICD-10-CM | POA: Insufficient documentation

## 2016-02-27 MED ORDER — SODIUM CHLORIDE 0.9 % IV BOLUS (SEPSIS)
1000.0000 mL | Freq: Once | INTRAVENOUS | Status: AC
  Administered 2016-02-27: 1000 mL via INTRAVENOUS

## 2016-02-27 MED ORDER — LORAZEPAM 2 MG/ML IJ SOLN
1.0000 mg | Freq: Once | INTRAMUSCULAR | Status: AC
  Administered 2016-02-27: 1 mg via INTRAVENOUS
  Filled 2016-02-27: qty 1

## 2016-02-27 MED ORDER — ONDANSETRON HCL 4 MG PO TABS
4.0000 mg | ORAL_TABLET | Freq: Four times a day (QID) | ORAL | Status: AC
Start: 2016-02-27 — End: ?

## 2016-02-27 MED ORDER — METOCLOPRAMIDE HCL 5 MG/ML IJ SOLN
10.0000 mg | Freq: Once | INTRAMUSCULAR | Status: AC
  Administered 2016-02-27: 10 mg via INTRAVENOUS
  Filled 2016-02-27: qty 2

## 2016-02-27 NOTE — ED Provider Notes (Signed)
CSN: [id number]     Arrival date & time 02/27/16  1049 History   First MD Initiated Contact with Patient 02/27/16 1131     Chief Complaint  Patient presents with  . Migraine  . Dizziness   HPI   Amber Ramirez is a 40 y.o. female PMH significant for anemia, HTN, bipolar disorder, depression presenting with a 3 week history of headache. She describes the headache as frontal/left sided, radiating around her head. She states she gets these headaches daily, they are gradual onset, and not relieved by motrin.  She endorses blurred vision out of both eyes, nausea/vomiting (NBNB, multiple episodes). She denies fevers, chills.  She states, "If y'all don't find something and I have something going on, I will sue you."  Past Medical History  Diagnosis Date  . Anemia   . Hypertension   . Bipolar 1 disorder (HCC)   . Depression   . Heart murmur    Past Surgical History  Procedure Laterality Date  . Tubal ligation     Family History  Problem Relation Age of Onset  . Schizophrenia Mother   . Cancer Mother     lung  . Hypertension Mother   . Diabetes Father    Social History  Substance Use Topics  . Smoking status: Current Every Day Smoker -- 1.00 packs/day for 16 years    Types: Cigars, Cigarettes  . Smokeless tobacco: Never Used     Comment: black and mild-4-5/day  . Alcohol Use: Yes     Comment: rare   OB History    Gravida Para Term Preterm AB TAB SAB Ectopic Multiple Living   Review of Systems  Ten systems are reviewed and are negative for acute change except as noted in the HPI  Allergies  Penicillins; Aspirin; and Tylenol  Home Medications   Prior to Admission medications   Medication Sig Start Date End Date Taking? Authorizing Provider  clindamycin (CLEOCIN) 150 MG capsule Take 1 capsule (150 mg total) by mouth 3 (three) times daily. X 7 days Patient not taking: Reported on 12/13/2015 12/26/14   Mathis Fare Presson, PA  ibuprofen (ADVIL,MOTRIN) 600  MG tablet Take 1 tablet (600 mg total) by mouth every 6 (six) hours as needed. Patient not taking: Reported on 12/13/2015 12/26/14   Mathis Fare Presson, PA  ondansetron (ZOFRAN) 4 MG tablet Take 1 tablet (4 mg total) by mouth every 6 (six) hours. 02/27/16   Melton Krebs, PA-C   BP 175/89 mmHg  Pulse 47  Temp(Src) 98.4 F (36.9 C) (Oral)  Resp 19  SpO2 98%  LMP 02/12/2016 Physical Exam  Constitutional: She is oriented to person, place, and time. She appears well-developed and well-nourished. No distress.  HENT:  Head: Normocephalic and atraumatic.  Right Ear: External ear normal.  Left Ear: External ear normal.  Nose: Nose normal.  Mouth/Throat: Oropharynx is clear and moist. No oropharyngeal exudate.  Normal TM BL. No erythema, bulging.   Eyes: Conjunctivae and EOM are normal. Pupils are equal, round, and reactive to light. Right eye exhibits no discharge. Left eye exhibits no discharge. No scleral icterus.  Neck: Normal range of motion. Neck supple. No tracheal deviation present.  Cardiovascular: Normal rate.   Pulmonary/Chest: Effort normal. No respiratory distress.  Abdominal: She exhibits no distension.  Musculoskeletal: Normal range of motion. She exhibits no edema or tenderness.  Lymphadenopathy:    She has no  cervical adenopathy.  Neurological: She is alert and oriented to person, place, and time. Coordination normal.  Cranial nerves II through XII grossly intact. Normal pronator drift, Romberg, rapid alternating movements.  Skin: Skin is warm and dry. No rash noted. She is not diaphoretic. No erythema.  Psychiatric: She has a normal mood and affect. Her behavior is normal.  Nursing note and vitals reviewed.   ED Course  Procedures  Imaging Review Ct Head Wo Contrast  02/27/2016  CLINICAL DATA:  Ongoing migraine headache. EXAM: CT HEAD WITHOUT CONTRAST TECHNIQUE: Contiguous axial images were obtained from the base of the skull through the vertex without  intravenous contrast. COMPARISON:  11/08/2014. FINDINGS: There is no evidence for acute hemorrhage, hydrocephalus, mass lesion, or abnormal extra-axial fluid collection. No definite CT evidence for acute infarction. The visualized paranasal sinuses and mastoid air cells are clear. IMPRESSION: Normal CT evaluation of the brain. Electronically Signed   By: Kennith CenterEric  Mansell M.D.   On: 02/27/2016 13:18   I have personally reviewed and evaluated these images and lab results as part of my medical decision-making.  MDM   Final diagnoses:  Headache, unspecified headache type   Patient nontoxic appearing, VSS. No meningeal signs. Doubt vascular, traumatic, infectious, environmental etiologies. Most likely primary headache.  BMP and hcg from 02/25/16 are unremarkable. No need to repeat labs, as this is the same headache. Imaged patient for new visual changes. CT head unremarkable.  Patient sleeping and feeling improved after IV fluid bolus, reglan, and ativan.  Patient may be safely discharged home. Discussed reasons for return. Patient to follow-up with neurology for headaches and PCP for elevated blood pressure management. Patient in understanding and agreement with the plan.  Melton KrebsSamantha Nicole Jahon Bart, PA-C 02/27/16 1351  Benjiman CoreNathan Pickering, MD 02/28/16 2351

## 2016-02-27 NOTE — Discharge Instructions (Signed)
Ms. Amber Ramirez,  Nice meeting you! Please follow-up with neurology. Return to the emergency department if you develop increased headaches, inability to keep foods down, slurred speech, weakness, new/worsening symptoms. Feel better soon!  General Headache Without Cause A headache is pain or discomfort felt around the head or neck area. The specific cause of a headache may not be found. There are many causes and types of headaches. A few common ones are:  Tension headaches.  Migraine headaches.  Cluster headaches.  Chronic daily headaches. HOME CARE INSTRUCTIONS  Watch your condition for any changes. Take these steps to help with your condition: Managing Pain  Take over-the-counter and prescription medicines only as told by your health care provider.  Lie down in a dark, quiet room when you have a headache.  If directed, apply ice to the head and neck area:  Put ice in a plastic bag.  Place a towel between your skin and the bag.  Leave the ice on for 20 minutes, 2-3 times per day.  Use a heating pad or hot shower to apply heat to the head and neck area as told by your health care provider.  Keep lights dim if bright lights bother you or make your headaches worse. Eating and Drinking  Eat meals on a regular schedule.  Limit alcohol use.  Decrease the amount of caffeine you drink, or stop drinking caffeine. General Instructions  Keep all follow-up visits as told by your health care provider. This is important.  Keep a headache journal to help find out what may trigger your headaches. For example, write down:  What you eat and drink.  How much sleep you get.  Any change to your diet or medicines.  Try massage or other relaxation techniques.  Limit stress.  Sit up straight, and do not tense your muscles.  Do not use tobacco products, including cigarettes, chewing tobacco, or e-cigarettes. If you need help quitting, ask your health care provider.  Exercise  regularly as told by your health care provider.  Sleep on a regular schedule. Get 7-9 hours of sleep, or the amount recommended by your health care provider. SEEK MEDICAL CARE IF:   Your symptoms are not helped by medicine.  You have a headache that is different from the usual headache.  You have nausea or you vomit.  You have a fever. SEEK IMMEDIATE MEDICAL CARE IF:   Your headache becomes severe.  You have repeated vomiting.  You have a stiff neck.  You have a loss of vision.  You have problems with speech.  You have pain in the eye or ear.  You have muscular weakness or loss of muscle control.  You lose your balance or have trouble walking.  You feel faint or pass out.  You have confusion.   This information is not intended to replace advice given to you by your health care provider. Make sure you discuss any questions you have with your health care provider.   Document Released: 07/29/2005 Document Revised: 04/19/2015 Document Reviewed: 11/21/2014 Elsevier Interactive Patient Education [employer]2016 Elsevier Inc.

## 2016-02-27 NOTE — ED Notes (Signed)
Pt reports ongoing migraine and emesis that is not going away and is gradually getting worse. Has also had lightheadedness/dizziness.

## 2016-03-07 ENCOUNTER — Encounter (HOSPITAL_COMMUNITY): Payer: Self-pay | Admitting: Nurse Practitioner

## 2016-03-07 ENCOUNTER — Ambulatory Visit (HOSPITAL_COMMUNITY)
Admission: EM | Admit: 2016-03-07 | Discharge: 2016-03-07 | Disposition: A | Discharge status: Home / Self Care | Payer: Medicare Other | Attending: Family Medicine | Admitting: Family Medicine

## 2016-03-07 DIAGNOSIS — Z9851 Tubal ligation status: Secondary | ICD-10-CM | POA: Insufficient documentation

## 2016-03-07 DIAGNOSIS — F17200 Nicotine dependence, unspecified, uncomplicated: Secondary | ICD-10-CM | POA: Diagnosis not present

## 2016-03-07 DIAGNOSIS — I10 Essential (primary) hypertension: Secondary | ICD-10-CM | POA: Insufficient documentation

## 2016-03-07 DIAGNOSIS — IMO0001 Reserved for inherently not codable concepts without codable children: Secondary | ICD-10-CM

## 2016-03-07 DIAGNOSIS — M79601 Pain in right arm: Secondary | ICD-10-CM | POA: Diagnosis present

## 2016-03-07 DIAGNOSIS — Z880 Allergy status to penicillin: Secondary | ICD-10-CM | POA: Diagnosis not present

## 2016-03-07 DIAGNOSIS — R631 Polydipsia: Secondary | ICD-10-CM | POA: Diagnosis not present

## 2016-03-07 DIAGNOSIS — R350 Frequency of micturition: Secondary | ICD-10-CM

## 2016-03-07 DIAGNOSIS — I809 Phlebitis and thrombophlebitis of unspecified site: Secondary | ICD-10-CM | POA: Diagnosis not present

## 2016-03-07 DIAGNOSIS — Z79899 Other long term (current) drug therapy: Secondary | ICD-10-CM | POA: Diagnosis not present

## 2016-03-07 DIAGNOSIS — M79602 Pain in left arm: Secondary | ICD-10-CM | POA: Diagnosis present

## 2016-03-07 LAB — POCT URINALYSIS DIP (DEVICE)
Bilirubin Urine: NEGATIVE
GLUCOSE, UA: NEGATIVE mg/dL
KETONES UR: NEGATIVE mg/dL
Leukocytes, UA: NEGATIVE
Nitrite: NEGATIVE
PH: 6 (ref 5.0–8.0)
PROTEIN: 100 mg/dL — AB
Specific Gravity, Urine: 1.025 (ref 1.005–1.030)
UROBILINOGEN UA: 0.2 mg/dL (ref 0.0–1.0)

## 2016-03-07 LAB — RAPID URINE DRUG SCREEN, HOSP PERFORMED
Amphetamines: NOT DETECTED
Barbiturates: NOT DETECTED
Benzodiazepines: NOT DETECTED
Cocaine: NOT DETECTED
Opiates: NOT DETECTED
Tetrahydrocannabinol: NOT DETECTED

## 2016-03-07 MED ORDER — CLONIDINE HCL 0.1 MG PO TABS
0.1000 mg | ORAL_TABLET | Freq: Once | ORAL | Status: AC
  Administered 2016-03-07: 0.1 mg via ORAL

## 2016-03-07 MED ORDER — HYDROCHLOROTHIAZIDE 12.5 MG PO CAPS: 12.5000 mg | ORAL_CAPSULE | Freq: Every day | ORAL | 0 refills | Status: AC

## 2016-03-07 MED ORDER — CLONIDINE HCL 0.1 MG PO TABS
ORAL_TABLET | ORAL | Status: AC
  Filled 2016-03-07: qty 1

## 2016-03-07 NOTE — ED Triage Notes (Signed)
Pt presents with a complaint of multiple painful sites on both arms where she reports she has had IVs placed at [facility] and [facility] over past month. She has 2 painful sites to R forearm and 1 to L hand. She is alert and breathing easily

## 2016-03-07 NOTE — Discharge Instructions (Signed)
Your arm pain is to be expected after having an IV line out, or infiltrate. If you want any further workup. Must go to the emergency room. There is no evidence of UTI today. The increased urination is likely from drinking so much liquid. Please cut down on did not eat drink. Please start her blood pressure medicines and follow-up with your care doctor for blood pressure management.

## 2016-03-07 NOTE — ED Notes (Signed)
Given Clonidine 0.1 mg

## 2016-03-07 NOTE — ED Notes (Addendum)
Patient c/o sharp left sided chest pain. Reports she may just be hungry. Informed the doctor. When I returned to room patient states "it's not bad enough to get the doctor probably just hunger" Patient then sat up and took a phone call on her cell phone. Informed patients husband to let us know if pain gets worse.

## 2016-03-07 NOTE — ED Notes (Signed)
Patient reports she feels tired and hungry and asked for graham crackers. Given two graham crackers. Patient sitting upright and on the phone when I returned to room.

## 2016-03-07 NOTE — ED Provider Notes (Signed)
[facility]    CSN: [id number] Arrival date & time: 03/07/16  2836  First Provider Contact:  None       History   Chief Complaint Chief Complaint  Patient presents with  . Arm Pain    HPI Amber Ramirez is a 40 y.o. female.   HPI   Bilateral arm pain. Started after infiltration of IVs during her visits to the emergency room at [facility] long emesis cone. Predominantly right arm pain has resolved with persistent bruising. Left arm pain typically in the wrist area were IV infiltrated and the patient states that the tech trying to perform the IV to do what she was doing. Patient states that she feels as swollen. Denies any loss of sensation or loss of function in that arm. Patient does endorse polydipsia and polyuria over the last several days. Patient states that her husband has felt the same way. Patient denies fevers, general malaise, shortness of breath, palpitations, neck stiffness, headache. Patient was treated emergency room for headache which is completely resolved at this time. Patient states that her blood pressure was high at that time and is also high today but denies any headache or chest pain or palpitations. Recent does not take anything for blood pressure. States that her father had high blood pressure.  Past Medical History:  Diagnosis Date  . Anemia   . Bipolar 1 disorder (HCC)   . Depression   . Heart murmur   . Hypertension     Patient Active Problem List   Diagnosis Date Noted  . Acute psychosis 11/09/2014    Past Surgical History:  Procedure Laterality Date  . TUBAL LIGATION      OB History    Gravida Para Term Preterm AB Living   3 3 3     3    SAB TAB Ectopic Multiple Live Births                   Home Medications    Prior to Admission medications   Medication Sig Start Date End Date Taking? Authorizing Provider  clindamycin (CLEOCIN) 150 MG capsule Take 1 capsule (150 mg total) by mouth 3 (three) times daily. X 7 days Patient not  taking: Reported on 12/13/2015 12/26/14   Mathis Fare Presson, PA  hydrochlorothiazide (MICROZIDE) 12.5 MG capsule Take 1 capsule (12.5 mg total) by mouth daily. 03/07/16   Ozella Rocks, MD  ibuprofen (ADVIL,MOTRIN) 600 MG tablet Take 1 tablet (600 mg total) by mouth every 6 (six) hours as needed. Patient not taking: Reported on 12/13/2015 12/26/14   Mathis Fare Presson, PA  ondansetron (ZOFRAN) 4 MG tablet Take 1 tablet (4 mg total) by mouth every 6 (six) hours. 02/27/16   Melton Krebs, PA-C    Family History Family History  Problem Relation Age of Onset  . Schizophrenia Mother   . Cancer Mother     lung  . Hypertension Mother   . Diabetes Father     Social History Social History  Substance Use Topics  . Smoking status: Current Every Day Smoker    Packs/day: 1.00    Years: 16.00    Types: Cigars, Cigarettes  . Smokeless tobacco: Never Used     Comment: black and mild-4-5/day  . Alcohol use Yes     Comment: rare     Allergies   Penicillins; Aspirin; and Tylenol [acetaminophen]   Review of Systems Review of Systems Per HPI with all other pertinent systems negative.  Physical Exam Triage Vital Signs ED Triage Vitals  Enc Vitals Group     BP 03/07/16 1648 (!) 176/120     Pulse Rate 03/07/16 1648 64     Resp 03/07/16 1648 18     Temp 03/07/16 1648 98.2 F (36.8 C)     Temp Source 03/07/16 1648 Oral     SpO2 03/07/16 1648 98 %     Weight --      Height --      Head Circumference --      Peak Flow --      Pain Score 03/07/16 1655 8     Pain Loc --      Pain Edu? --      Excl. in GC? --    No data found.   Updated Vital Signs BP (!) 176/120   Pulse 64   Temp 98.2 F (36.8 C) (Oral)   Resp 18   Wt 96 lb (43.5 kg)   LMP 02/12/2016 Comment: negative HCG blood test 02-27-2016  SpO2 98%   BMI 18.75 kg/m   Visual Acuity Right Eye Distance:   Left Eye Distance:   Bilateral Distance:    Right Eye Near:   Left Eye Near:    Bilateral Near:      Physical Exam Physical Exam  Constitutional: oriented to person, place, and time. appears well-developed and well-nourished. No distress.  HENT:  Head: Normocephalic and atraumatic.  Eyes: EOMI. PERRL.  Neck: Normal range of motion.  Cardiovascular: RRR, no m/r/g, 2+ distal pulses,  Pulmonary/Chest: Effort normal and breath sounds normal. No respiratory distress.  Abdominal: Soft. Bowel sounds are normal. NonTTP, no distension.  Musculoskeletal: Normal range of motion. Non ttp, no effusion.  Neurological: alert and oriented to person, place, and time.  Skin: Right arm with ventral surface mild ecchymoses of approximately 4 x 2.5 cm on the distal forearm. No appreciable upper extremity erythema, induration bilaterally. Left dorsum of the wrist with single tracking vein that is somewhat stiff whereas possibly sclerosed. No surrounding erythema induration or fluctuance. Patient with full range of motion of upper extremities including the wrists. Grip strength 5 out of 5 bilaterally.Marland Kitchen  Psychiatric: normal mood and affect. behavior is normal. Judgment and thought content normal.    UC Treatments / Results  Labs (all labs ordered are listed, but only abnormal results are displayed) Labs Reviewed  POCT URINALYSIS DIP (DEVICE) - Abnormal; Notable for the following:       Result Value   Hgb urine dipstick TRACE (*)    Protein, ur 100 (*)    All other components within normal limits  URINE RAPID DRUG SCREEN, HOSP PERFORMED    EKG  EKG Interpretation None       Radiology No results found.  Procedures Procedures (including critical care time)  Medications Ordered in UC Medications  cloNIDine (CATAPRES) tablet 0.1 mg (not administered)     Initial Impression / Assessment and Plan / UC Course  I have reviewed the triage vital signs and the nursing notes.  Pertinent labs & imaging results that were available during my care of the patient were reviewed by me and considered in my  medical decision making (see chart for details).  Clinical Course   Phlebitis. Started the emergency room on the 18th. Overall appears to be improving without any evidence of active phlebitis this patient's pain seems more generalized as opposed to truly at the location of the vein was accessed. Additionally no evidence of cellulitis.  Patient with single tracking stiff vein on the dorsum of the left wrist which may have sclerosed after last visit to the emergency room. Recommending patient follow-up with her primary care physician or the emergency room she feels that she needs more emergent follow-up or imaging at this time for this. Reiterated to patient that x-ray at this point in time would not yield any benefit as it does not show soft tissue structures. I suspect the patient's urinary frequency is likely from polydipsia, psychogenic. Patient states that her symptoms of cholecystitis with her husband. Denies any other symptoms to be concerning for urinary tract infection. UDS was also sent as patient has been positive in the past for [facility] and her husband is currently exhibiting very odd behavior without clear thought process content which is also expressed and to lesser degree by patient.  Of note patient's blood pressure markedly elevated. On recheck it was 179/96. Denies any symptoms concerning for ACS or intracranial effects of elevated blood pressure. Will dose clonidine 0.1 mg by mouth in clinic and start patient on hydrochlorothiazide. Patient's follow-up appointment with her primary care office in mid August. She is here to the emergency room she develops palpitations from the high school thiazide which have been outlined for the patient.  Final Clinical Impressions(s) / UC Diagnoses   Final diagnoses:  Essential hypertension  Phlebitis  Frequency  Polydipsia    New Prescriptions New Prescriptions   HYDROCHLOROTHIAZIDE (MICROZIDE) 12.5 MG CAPSULE    Take 1 capsule (12.5 mg total) by  mouth daily.     Ozella Rocks, MD 03/07/16 [phone]

## 2016-05-06 IMAGING — CR DG CHEST 2V
2 series · 2 of 2 positions shown · non-contrast
Comparison: 12/22/2013

CLINICAL DATA: Chest pain

EXAM:
CHEST  2 VIEW

[w chest lat]
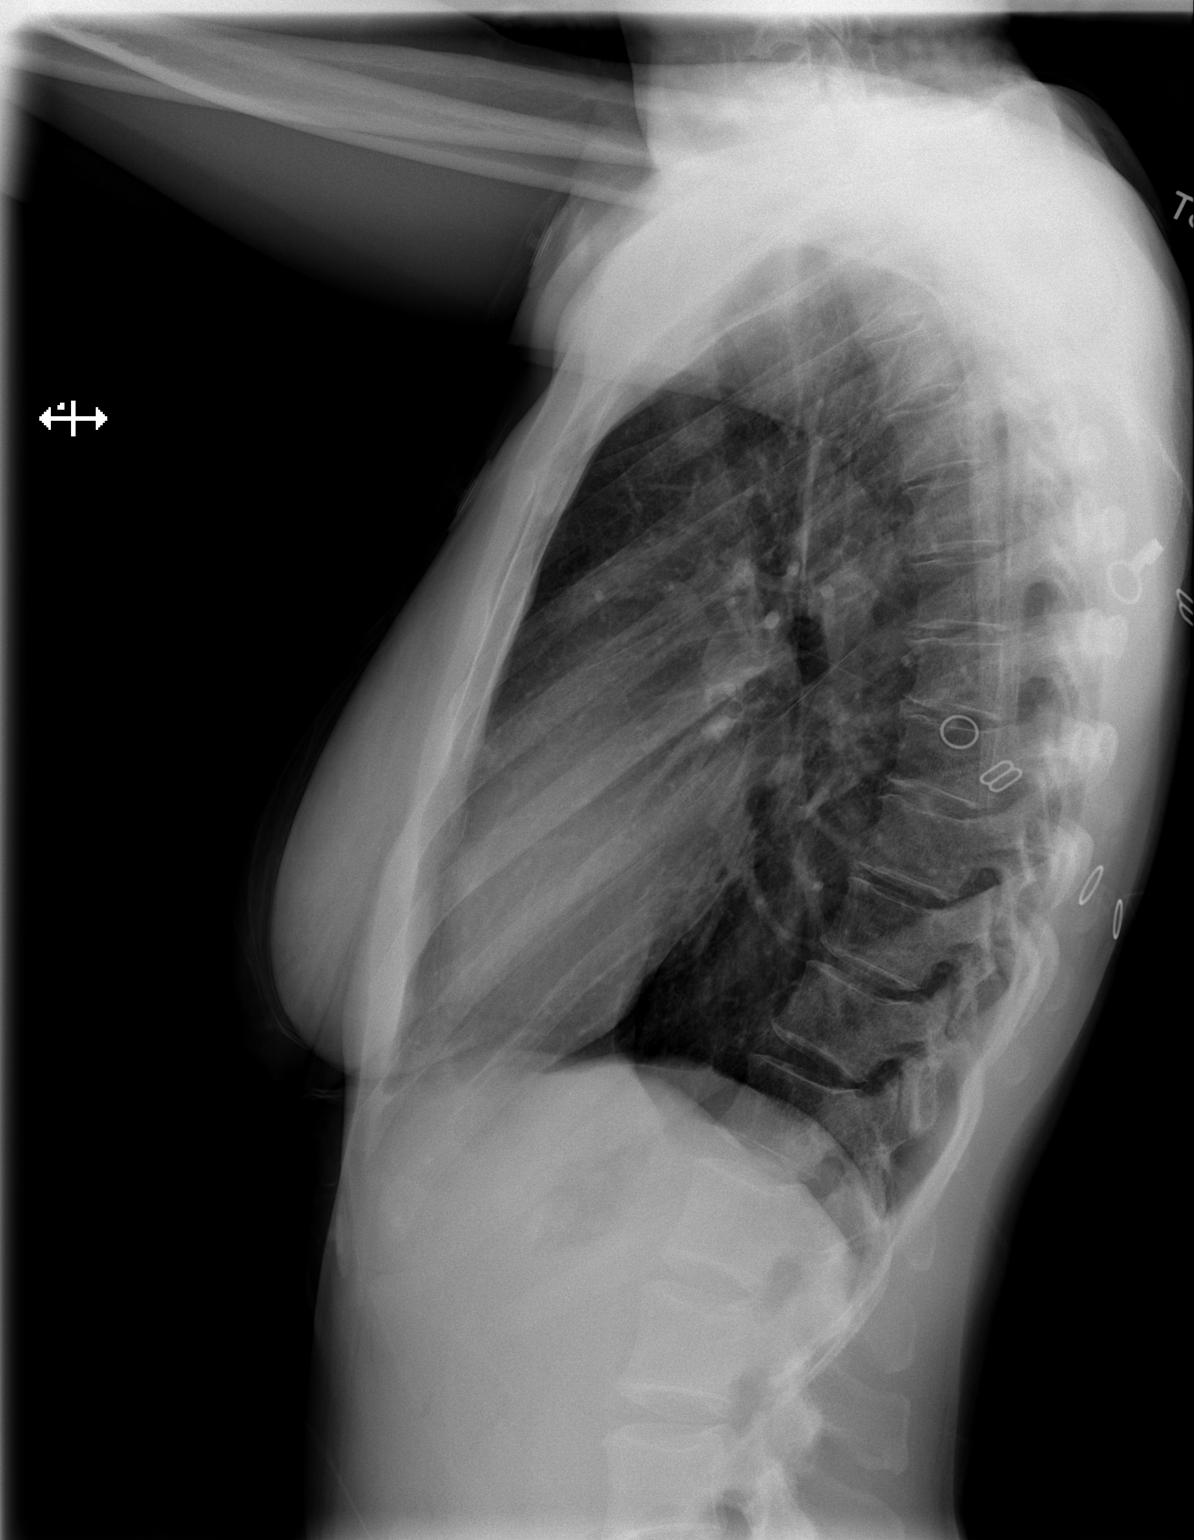

[w chest pa]
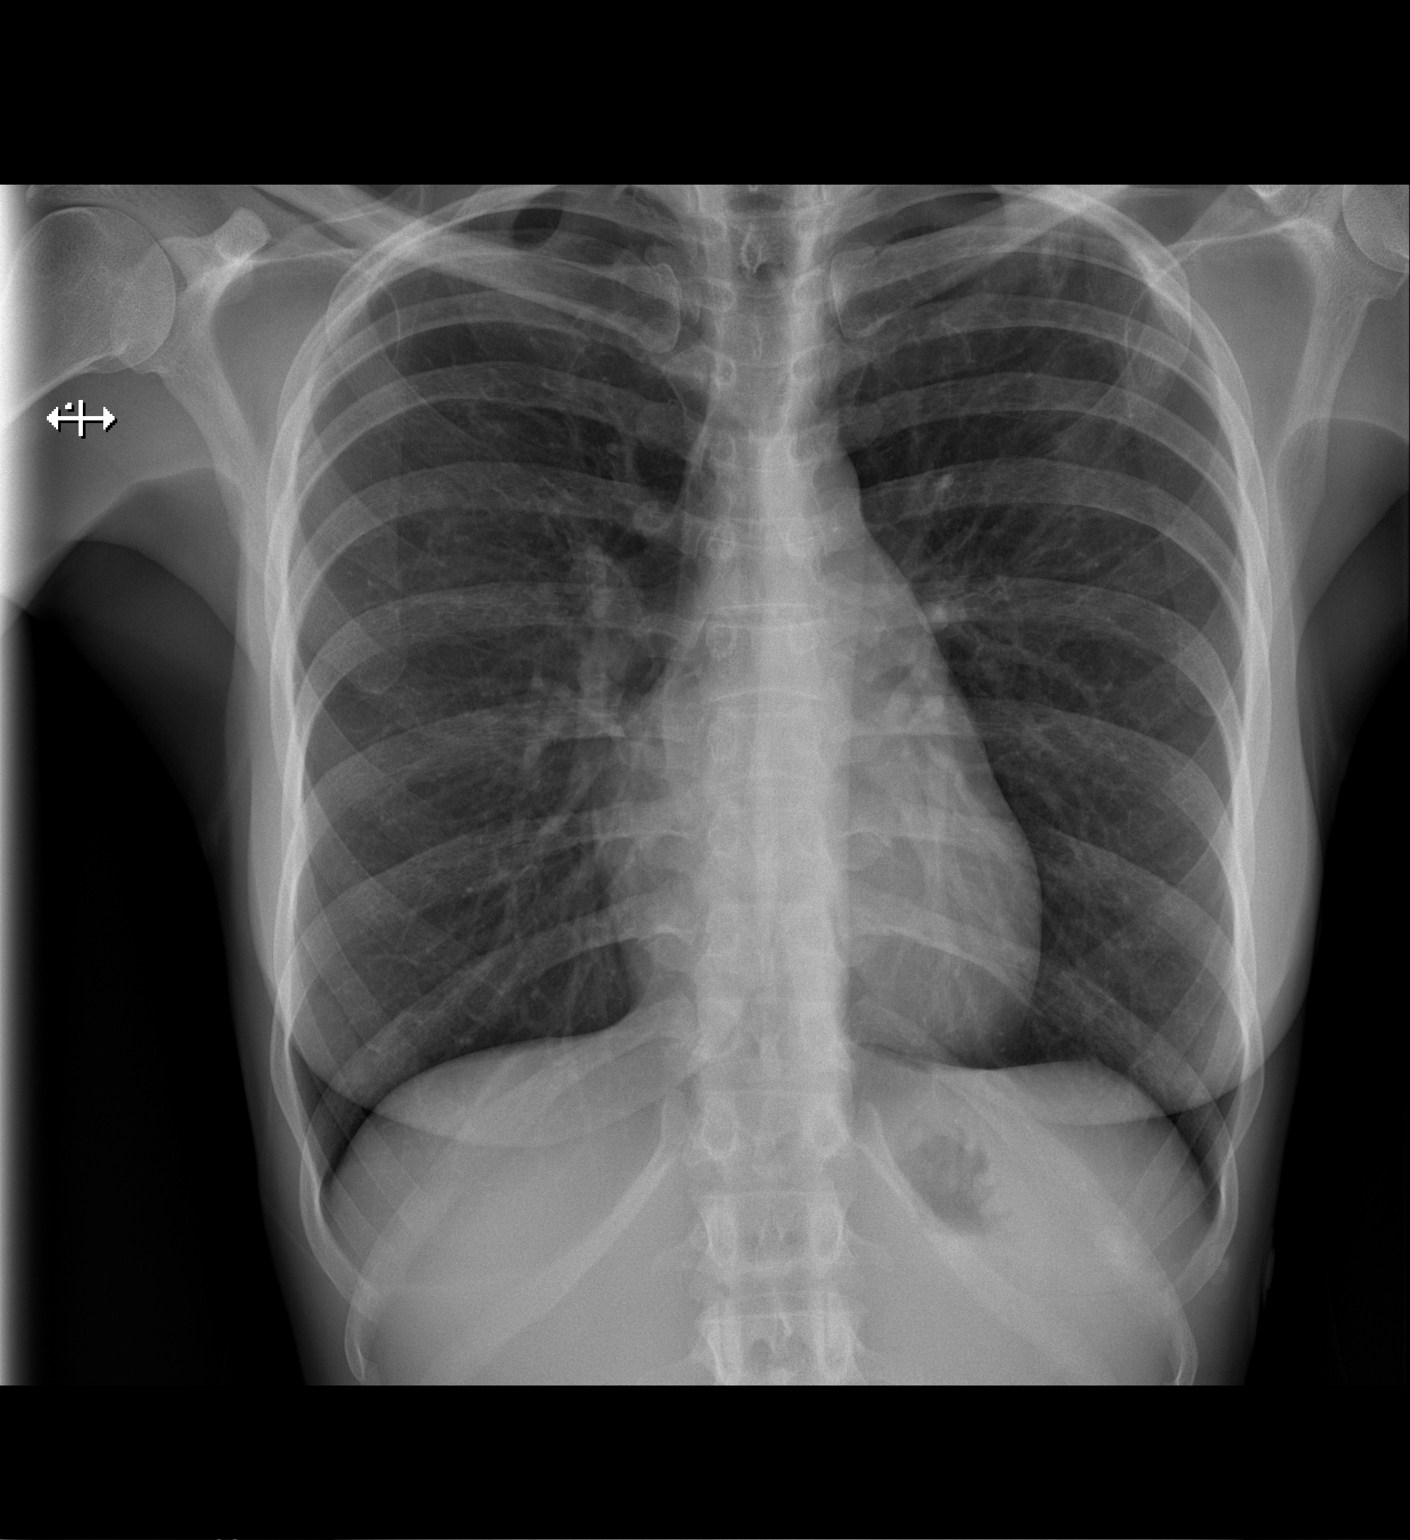

[2 of 2 positions shown; findings below may reference images not displayed]

FINDINGS: Mild hyperinflation. The heart size and mediastinal contours are
within normal limits. Both lungs are clear. The visualized skeletal
structures are unremarkable.
IMPRESSION: No active cardiopulmonary disease.

## 2016-05-21 ENCOUNTER — Inpatient Hospital Stay
Admit: 2016-05-21 | Discharge: 2016-05-28 | Disposition: A | Discharge status: Home / Self Care | Payer: MEDICARE | Source: Home / Self Care | Admitting: Psychiatry

## 2016-05-21 DIAGNOSIS — R45851 Suicidal ideations: Secondary | ICD-10-CM

## 2016-05-21 LAB — T4: T4, Total: 6.4 ug/dL (ref 4.5–11.7)

## 2016-05-21 LAB — URINE DRUG SCREEN
Amphetamine Screen, Urine: NOT DETECTED (ref <1000)
Barbiturate Screen, Ur: NOT DETECTED (ref <200)
Benzodiazepine Screen, Urine: NOT DETECTED (ref <200)
Cannabinoid Scrn, Ur: POSITIVE — AB
Cocaine Metabolite Screen, Urine: NOT DETECTED (ref <300)
Methadone Screen, Urine: NOT DETECTED (ref <300)
Opiate Scrn, Ur: NOT DETECTED
PCP Screen, Urine: NOT DETECTED (ref <25)
Propoxyphene Scrn, Ur: NOT DETECTED (ref <300)

## 2016-05-21 LAB — CBC WITH AUTO DIFFERENTIAL
Basophils %: 0.9 % (ref 0.0–2.0)
Basophils Absolute: 0.04 E9/L (ref 0.00–0.20)
Eosinophils %: 3.8 % (ref 0.0–6.0)
Eosinophils Absolute: 0.16 E9/L (ref 0.05–0.50)
Hematocrit: 36.5 % (ref 34.0–48.0)
Hemoglobin: 12.2 g/dL (ref 11.5–15.5)
Immature Granulocytes #: 0.01 E9/L
Immature Granulocytes %: 0.2 % (ref 0.0–5.0)
Lymphocytes %: 37.8 % (ref 20.0–42.0)
Lymphocytes Absolute: 1.61 E9/L (ref 1.50–4.00)
MCH: 27.1 pg (ref 26.0–35.0)
MCHC: 33.4 % (ref 32.0–34.5)
MCV: 80.9 fL (ref 80.0–99.9)
MPV: 14.2 fL — ABNORMAL HIGH (ref 7.0–12.0)
Monocytes %: 7.5 % (ref 2.0–12.0)
Monocytes Absolute: 0.32 E9/L (ref 0.10–0.95)
Neutrophils %: 49.8 % (ref 43.0–80.0)
Neutrophils Absolute: 2.12 E9/L (ref 1.80–7.30)
Platelets: 110 E9/L — ABNORMAL LOW (ref 130–450)
RBC: 4.51 E12/L (ref 3.50–5.50)
RDW: 16 fL — ABNORMAL HIGH (ref 11.5–15.0)
WBC: 4.3 E9/L — ABNORMAL LOW (ref 4.5–11.5)

## 2016-05-21 LAB — URINALYSIS
Bilirubin Urine: NEGATIVE
Glucose, Ur: NEGATIVE mg/dL
Ketones, Urine: NEGATIVE mg/dL
Leukocyte Esterase, Urine: NEGATIVE
Nitrite, Urine: NEGATIVE
Protein, UA: 100 mg/dL — AB
Specific Gravity, UA: 1.02 (ref 1.005–1.030)
Urobilinogen, Urine: 1 E.U./dL (ref <2.0)
pH, UA: 6 (ref 5.0–9.0)

## 2016-05-21 LAB — COMPREHENSIVE METABOLIC PANEL
ALT: 11 U/L (ref 0–32)
AST: 18 U/L (ref 0–31)
Albumin: 4 g/dL (ref 3.5–5.2)
Alkaline Phosphatase: 75 U/L (ref 35–104)
Anion Gap: 12 mmol/L (ref 7–16)
BUN: 13 mg/dL (ref 6–20)
CO2: 27 mmol/L (ref 22–29)
Calcium: 8.7 mg/dL (ref 8.6–10.2)
Chloride: 101 mmol/L (ref 98–107)
Creatinine: 0.8 mg/dL (ref 0.5–1.0)
GFR African American: >60
GFR Non-African American: >60 mL/min/{1.73_m2} (ref >=60)
Glucose: 116 mg/dL — ABNORMAL HIGH (ref 74–109)
Potassium: 3.9 mmol/L (ref 3.5–5.0)
Sodium: 140 mmol/L (ref 132–146)
Total Bilirubin: 0.3 mg/dL (ref 0.0–1.2)
Total Protein: 7.3 g/dL (ref 6.4–8.3)

## 2016-05-21 LAB — EKG 12-LEAD
Atrial Rate: 67 {beats}/min
P Axis: 73 degrees
P-R Interval: 160 ms
Q-T Interval: 444 ms
QRS Duration: 92 ms
QTc Calculation (Bazett): 469 ms
R Axis: 77 degrees
T Axis: 56 degrees
Ventricular Rate: 67 {beats}/min

## 2016-05-21 LAB — MICROSCOPIC URINALYSIS: RBC, UA: >20 /HPF (ref 0–2)

## 2016-05-21 LAB — SERUM DRUG SCREEN
Acetaminophen Level: <15 ug/mL (ref 10.0–30.0)
Ethanol Lvl: <10 mg/dL
Salicylate, Serum: <0.3 mg/dL (ref 0.0–30.0)
TCA Scrn: NEGATIVE ng/mL

## 2016-05-21 LAB — POC PREGNANCY UR-QUAL: Preg Test, Ur: NEGATIVE

## 2016-05-21 LAB — TSH: TSH: 1.15 u[IU]/mL (ref 0.270–4.200)

## 2016-05-21 MED ORDER — RISPERIDONE 1 MG PO TABS
1 MG | Freq: Two times a day (BID) | ORAL | Status: DC
Start: 2016-05-21 — End: 2016-05-26
  Administered 2016-05-22 – 2016-05-26 (×9): 1 mg via ORAL

## 2016-05-21 MED ORDER — BENZTROPINE MESYLATE 1 MG/ML IJ SOLN
1 MG/ML | Freq: Two times a day (BID) | INTRAMUSCULAR | Status: DC | PRN
Start: 2016-05-21 — End: 2016-05-28

## 2016-05-21 MED ORDER — HYDROXYZINE PAMOATE 50 MG PO CAPS
50 MG | Freq: Three times a day (TID) | ORAL | Status: DC | PRN
Start: 2016-05-21 — End: 2016-05-28

## 2016-05-21 MED ORDER — HALOPERIDOL LACTATE 5 MG/ML IJ SOLN
5 MG/ML | INTRAMUSCULAR | Status: DC | PRN
Start: 2016-05-21 — End: 2016-05-28

## 2016-05-21 MED ORDER — MAGNESIUM HYDROXIDE 400 MG/5ML PO SUSP
400 MG/5ML | Freq: Every day | ORAL | Status: DC | PRN
Start: 2016-05-21 — End: 2016-05-28

## 2016-05-21 MED ORDER — OLANZAPINE 5 MG PO TABS
5 MG | Freq: Once | ORAL | Status: AC | PRN
Start: 2016-05-21 — End: 2016-05-21

## 2016-05-21 MED ORDER — TRAZODONE HCL 50 MG PO TABS
50 MG | Freq: Every evening | ORAL | Status: DC | PRN
Start: 2016-05-21 — End: 2016-05-28

## 2016-05-21 MED ORDER — ALUM & MAG HYDROXIDE-SIMETH 200-200-20 MG/5ML PO SUSP
200-200-20 MG/5ML | ORAL | Status: DC | PRN
Start: 2016-05-21 — End: 2016-05-28

## 2016-05-21 MED FILL — RISPERIDONE 1 MG PO TABS: 1 MG | ORAL | Qty: 1

## 2016-05-21 MED FILL — TRAZODONE HCL 50 MG PO TABS: 50 MG | ORAL | Qty: 1
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
## 2016-05-21 NOTE — ED Notes (Signed)
Phone call to Dr. Lily PeerMichael Potesta  Received new order to admit patient to [facility]  Dx: Bipolar Disorder with Psychosis  Use Dr. Bluford KaufmannKassawat's Order Set  Tylenol removed from order set r/t patient has listed on allergy list     Kathrene BongoCrystal Kristina Bertone, RN  05/21/16 [phone]
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
## 2016-05-21 NOTE — Progress Notes (Signed)
Patient is refusing the risperdal. She is angry that staff are trying to give it to her. States that she actively takes Seroquel 100mg  at home every night & that is the only medication she will take.     Electronically signed by Alvia GroveAlyssa K Tommie Dejoseph, RN on 05/21/2016 at 2:02 PM

## 2016-05-21 NOTE — ED Notes (Signed)
Notified Patty in admitting of bed assignment 7523 and faxed pink slip to [id number]     Sum[facility] Hospital[facility]ethann Wheaton  05/21/16 0457

## 2016-05-21 NOTE — ED Notes (Signed)
Pt asking for her husband. States "both of us were in the ER together. He came in for suicide. He was worst than me. They just took me back first.. His name is Government social research officerJohnny Nguyen".      Regina Nguyen, [place]  05/21/16 [phone]

## 2016-05-21 NOTE — Progress Notes (Signed)
Patient did not attend Wrap-up/Goals Group

## 2016-05-21 NOTE — Progress Notes (Signed)
Patient states that her boyfriend's family brought both of them in because they both took pills. Patient states that she took 4 of her Seroquel to help her and sleep and if she didn't wake up that was okay with her. States she has been in and out of hospitals since she was 40 years old because her family didn't understand that she was given the gift of prophecy the only one that understood was her grandmother. Patient states that she is fearful for her 41 year old daughter because her daughter has the same gift but the family took her daughter away. Patient states over and over " I cannot help the gift GOD has given me , I cannot help that I know about things before they happen" . Patient tried to show an example of her knowing what was going to happen before it did when another nurse asked this nurse a question but the patient just repeated word for word immediately after it was said. She calls her boyfriends family the devil because they are fearful of what she sees and says.    Patient has two belongings bags in locker 29.
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
## 2016-05-21 NOTE — ED Notes (Signed)
[facility] RN on 7SE at [phone]. Pt assigned to Room 7523.     [place] Sylvanna Burggraf, [place]  05/21/16 [phone]

## 2016-05-21 NOTE — Progress Notes (Signed)
UP ON UNIT. IN CONTROL. MOOD IN CONTROL. DENIES SUICIDAL IDEATION, HOMICIDAL IDEATION AND HALLUCINATIONS AT THIS TIME.
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
## 2016-05-21 NOTE — Plan of Care (Signed)
Problem: Altered Mood, Psychotic Behavior  Goal: LTG-Appropriate social interaction  Outcome: Ongoing  PLEASANT UPON APPROACH. DENIES SUICIDAL IDEATIONS OR INTENT TO HARM SELF OR OTHERS. DENIES HALLUCINATIONS. STANDING AT WINDOW MAKING PRAYER LIKE GESTURES TO THE ?WINDOW. TAKING PO FOODS AND FLUIDS WELL.

## 2016-05-21 NOTE — Progress Notes (Signed)
`[facility]  Admission Note     Admission Type: INVOLUNTARY FROM [facility]       Reason for admission:  Reason for Admission: MANIA, HOMICIDAL IDEATION, HALLUCINATIONS    PATIENT STRENGTHS:  Strengths: Other, Social Skills    Patient Strengths and Limitations:  Limitations: Inappropriate/potentially harmful leisure interests    Addictive Behavior:   Addictive Behavior  In the past 3 months, have you felt or has someone told you that you have a problem with:  : None (NONE KNOWN)  Do you have a history of Chemical Use?: No  Do you have a history of Alcohol Use?: No  Do you have a history of Street Drug Abuse?: No  Histroy of Prescripton Drug Abuse?: No    Medical Problems:   Past Medical History:   Diagnosis Date   ??? Asthma    ??? Hypertension        Status EXAM:  Status and Exam  Normal: No  Facial Expression: Worried, Hostile  Affect: Blunt  Level of Consciousness: Alert  Mood:Normal: No  Mood: Depressed, Anxious, Sad, Angry  Motor Activity:Normal: No  Motor Activity: Other(See Comments)  Interview Behavior: Cooperative  Preception: Orient to Person, Orient to Time, Orient to Place  Attention:Normal: No  Attention: Others(See comment), Distractible, Unable to Concentrate  Thought Processes: Tangential  Thought Content:Normal: No  Thought Content: Delusions (GRANDEUR)  Hallucinations: None  Delusions: Yes  Delusions: Grandeur  Memory:Normal: No  Memory: Other(See comment), Confabulation  Insight and Judgment: No  Insight and Judgment: Poor Insight, Poor Judgment  Present Suicidal Ideation: No  Present Homicidal Ideation: No    Tobacco Screening:  Practical Counseling, on admission, mark X, if applicable and completed (first 3 are required if patient doesn't refuse):            ( )  Recognizing danger situations (included triggers and roadblocks)                    ( )  Coping skills (new ways to manage stress, exercise, relaxation techniques, changing routine, distraction)                                                            ( )  Basic information about quitting (benefits of quitting, techniques in how to quit, available resources  ( ) [facility]                                           ( X) Patient refused counseling  ( ) Patient has not smoked in the last 30 days      Pt admitted with followings belongings:  Dentures: None  Vision - Corrective Lenses: None  Hearing Aid: None  Jewelry: None  Clothing: Jacket / coat, Footwear  Were All Patient Medications Collected?: Not Applicable  Other Valuables: Purse, Cell phone     Purse and cell phone to locker.  Patient's home medications were not present.  Patient oriented to surroundings and program expectations and copy of patient rights given. Received admission packet:  yes.  Consents reviewed, signed involuntary rights. Refused selective assessments.  Patient verbalize understanding:  yes.    Patient education on precautions: suicide, assault                   Antony OdeaMia Sofiah Lyne, RN

## 2016-05-21 NOTE — Progress Notes (Signed)
Positively participated in afternoon meet & greet group.  Aware of evening changes and shared progress of daily goal with peers.  Prefers to be called "Reesey".  Provided tour after group, as she was unfamiliar with unit.

## 2016-05-21 NOTE — Progress Notes (Signed)
Patient declined to attend community meeting. Patient identified goal for the day as: "Find my husband"

## 2016-05-21 NOTE — ED Notes (Signed)
Pt alert, oriented with noted irritability and guarded. Pt states "I don't answer to [employer], I'm using [employer]aze". [place]. Noted suicidal ideation on arrival. Pt denies suicidal ideation at present, but, states "going to sleep forever until The Lord comes back. Take Seroquel". Denies homicidal thoughts. Denies auditory/visual hallucinations. Denies pain/discomfort. Denies pain/discomfort.      Gaylordsvillerystal Shakirah Kirkey, [place]  05/21/16 [phone]

## 2016-05-21 NOTE — Care Coordination-Inpatient (Signed)
te: 05/21/2016  Start Time: 11:00  End Time:  11:45  Number of Participants:11    Type of Group: Psychotherapy    Wellness Binder Information  Module Name:   Session Number:      Patient's Goal:  Gain insight into interpersonal relationships by interacting with the group.    Notes:  Pt was able to express strong feeling regarding  Ways they have sabotage relationships in the past. Pt was given support and positive feed back.    Status After Intervention:  Improved    Participation Level: Interactive    Participation Quality: Attentive and Sharing      Speech:  None    Thought Process/Content: Logical      Affective Functioning: Congruent      Mood: anxious and depressed      Level of consciousness:  Attentive      Response to Learning: Able to verbalize/acknowledge new learning      Endings: None Reported    Modes of Intervention: Support and Socialization      Discipline Responsible: Social Worker/Counselor      Signature:  Julieana Eshleman, LISW

## 2016-05-21 NOTE — ED Notes (Signed)
Transportation request placed in [facility].     [place] Corey Laski, RN  05/21/16 [phone]

## 2016-05-21 NOTE — Progress Notes (Signed)
Psychoeducation assessment was completed.

## 2016-05-21 NOTE — Plan of Care (Signed)
Problem: Anger Management/Homicidal Ideation  Goal: STG-Able to express anger through appropriate verbalization and healthy physical outlets  Outcome: Met This Shift  Patient is verbally expressing anger without raising voice or cursing. Patient attempts to calm self by talking about happy topics, such as her 3 children.  Goal: LTG-Absence of homicidal ideation  Outcome: Met This Shift  Patient denies any HI.    Problem: Altered Mood, Manic Behavior  Goal: LTG-Able to sleep  Outcome: Met This Shift  Patient rested during shift.    Problem: Altered Mood, Psychotic Behavior  Goal: LTG-Able to verbalize decrease in frequency and intensity of hallucinations  Outcome: Met This Shift  Patient denies any hallucinations.  Goal: STG-Medication therapy compliance  Outcome: Not Met This Shift  Patient is refusing resperdal.    Comments: Patient denies any SI/HI/hallucinations. States that she has never felt suicidal at any point in her life.    Electronically signed by Vangie Bicker, RN on 05/21/2016 at 2:25 PM

## 2016-05-22 MED FILL — RISPERIDONE 1 MG PO TABS: 1 MG | ORAL | Qty: 1

## 2016-05-22 MED FILL — TRAZODONE HCL 50 MG PO TABS: 50 MG | ORAL | Qty: 1

## 2016-05-22 MED FILL — HYDROXYZINE PAMOATE 50 MG PO CAPS: 50 MG | ORAL | Qty: 1

## 2016-05-22 NOTE — Progress Notes (Signed)
Psycho-education assessment completed.

## 2016-05-22 NOTE — Behavioral Health Treatment Team (Addendum)
DATE OF SERVICE:     05/22/2016    Regina Nguyen seen today for the purpose of continuation of care.  Nursing, social work reports, laboratory studies and vital signs are reviewed.  Patient continues to display bizarre and disorganized behavior.  She is easily agitated.  Has rapid pressured speech.  Mood is elevated.  She has poor insight.  Denies that she made suicidal or homicidal statements prior to admission.  States marijuana was in her tox because she got it from second hand smoke.  When I went to interview her today she was standing in her room facing the wall and praying/"talking to God".  She has been making religious gestures at the nurses' station.  Disorganized thought process, tangential as well.   She reported today she feels medication will kill her.  Illogical thinking, stated with certainty today that she takes "500mg  of Prozac", when I confronted her regarding the maximum dose being under 80mg  and under she insisted she takes 500mg  of Prozac.    Patient chief complaint today is:             []  Depression      []  Anxiety        [x]  Psychosis         []  Suicidal/Homicidal                         [x]  Delusions           []  Aggression          Subjective:     Today patient states that    Sleep:  []  Good [x]  Fair  []  Poor  Appetite:  []  Good [x]  Fair  []  Poor    Depression:  denies    Anxiety: []  Mild [x]  Moderate []  Severe    []  Constant [x]  Sporadic     Delusions: []  Mild []  Moderate [x]  Severe     [x]  Constant []  Sporadic     [x]  Paranoid []  Somatic [x]  Grandiose     Hallucinations: denies but appears internally stimulated and religiously preoccupied.         Suicidal: denies  Homicidal: denies    Unscheduled Medications     []  Patient Receiving Emergency Medications " Chemical Restraint"   []  Requesting PRN medications for anxiety    Psychiatric Review of systems  Delusions:  []  Denies [x]  Endorses   Withdrawals:  [x]  Denies []  Endorses    Hallucinations: [x]  Denies []  Endorses    Extra Pyramidal  Symptoms: [x]  Denies []  Endorses      Medical Review of Systems:     All other than marked systmes have been reviewed and are all negative.    Constitutional Symptoms: []   fever []   Chills  Skin Symptoms: []  rash []   Pruritus   Eye Symptoms: []  Vision unchanged []   recent vision problems[]  blurred vision   Respiratory Symptoms:[]  shortness of breath []  cough  Cardiovascular Symptoms:  []  chest pain   []  palpitations   Gastrointestinal Symptoms: []   abdominal pain []   nausea []   vomiting []   diarrhea  Genitourinary Symptoms: []   dysuria  []   hematuria   Musculoskeletal Symptoms: []   back pain []   muscle pain []   joint pain  Neurologic Symptoms: []   headache []   dizziness  Hematolymphoid Symptoms: []  Adenopathy []  Bruises   []  Schimosis       Mental Status Examination:    Cognition:      [  x] Alert  [x]  Awake  [x]  Oriented  [x]  Person  [x]  Place []  Time      []  drowsy  []  tired  []  lethargic  [x]  distractable  []  Other     Attention/Concentration:   []  Attentive  [x]  Distracted        Memory Recent and Remote: [x]  Intact   []  Impaired []  Partially Impaired     Language: [x]  Able to recognize and name objects          []  Unable to recognize and name Objects    Fund of Knowledge:  []  Poor [x]   Fair  []  Good    Speech: []  Normal  []  Soft  []  Slow  [x]  Fast [x]  Pressured            [x]  Loud []  Dysarthria  []  Incoherent    Appearance: []  Well Groomed  [x]  Casual Dressed  []  Unkept  []  Disheveled          []  Normal weight[x]  Thin  []  Overweight  []  Obese           Attitude: []  Positive  [x]  Hostile  [x]  Demanding  [x]  Guarded  [x]  Defensive         []  Cooperative  [x]   Uncooperative      Behavior:  [x]  Normal Gait  []  Walks with Assistance  []  Geri Chair    []  Walks with Walker  []  In Hospital Bed  []  Sitting in Chair    Muscle-Skeletal:  [x]  Normal Muscle Tone []  Muscle Atrophy       []  Abnormal Muscle Movement     Eye Contact:  []  Good eye contact  [x]  Intermittent Eye Contact  []  Poor Eye Contact     Mood: []  Depressed   []  Anxious  [x]  Irritated  []  Euthymic   [x]  Angry []  Restless    Affect:  []  Congruent  []  Incongruent  [x]  Labile  []  Constricted  []  Flat  [x]  Bizarre     Thought Process and Association:  []  Logical [x]  Illogical       []  Linear and Goal Directed  [x]  Tangential  []  Circumstantial     Thought Content:  []  Denies []  Endorses []  Suicidal []  Homicidal  [x]  Delusional      [x]  Paranoid  []  Somatic  [x]  Grandiose    Perception: []   None  [x]  Auditory   []  Visual  []  tactile   []  olfactory  []  Illusions         Insight: []  Intact  []  Fair  [x]  Limited    Judgement:  []  Intact  []  Fair  [x]  Limited      Assessment/Plan:        Patient Active Problem List   Diagnosis Code   ??? Severe manic bipolar 1 disorder with psychotic behavior (HCC) F31.2         Plan:    [x]   Patient is refusing medications  []  Improving as expected   [x]  Not improving as expected   []  Worsening    []   At Baseline       Reason for more than one antipsychotic:  [x]  N/A  []  3 failed monotherapy(drugs tried):  []  Cross over to a new antipsychotic  []  Taper to monotherapy from polypharmacy  []  Augmentation of Clozapine therapy due to treatment resistance to single therapy     1- Patient refused Risperdal, will require forced medication application for probate court, also  not willing to sign into hospital, will require probate for ongoing stay and stabilization.    2- Patient still manic and psychotic, delusional, represents severe risk of harm to self and others if d/c at this time.      Signed:  Toney Reil  05/22/2016  2:08 PM

## 2016-05-22 NOTE — Plan of Care (Signed)
Problem: Altered Mood, Manic Behavior  Goal: LTG-Increase control over impulses  Outcome: Ongoing  11-7am shift note:  Pt observed to be asleep with easy even respirations at HS rounds since 2300.  RN hourly rounds provided to pt this shift.

## 2016-05-22 NOTE — Plan of Care (Addendum)
Problem: Anger Management/Homicidal Ideation  Goal: STG-Able to express anger through appropriate verbalization and healthy physical outlets  Outcome: Met This Shift    Goal: LTG-Absence of homicidal ideation  Outcome: Met This Shift      Problem: Altered Mood, Manic Behavior  Goal: LTG-Appropriate social interaction  Outcome: Ongoing    Goal: LTG-Increase control over impulses  Outcome: Ongoing      Problem: Altered Mood, Depressive Behavior  Goal: LTG-Able to verbalize acceptance of life and situations over which he or she has no control  Outcome: Ongoing    Goal: STG-Able to verbalize suicidal ideations  Outcome: Met This Shift    Goal: LTG-Absence of self-harm  Outcome: Ongoing      Comments: Patient presently denies suicidal, homicidal thoughts, or hallucinations. Patient expresses concerns over significant other's family 's intentions during visit this evening. Patient stated " I just want to go back to [place]. Patient goes off into a tangent when describing how she got to this unit. Compliant with medications. Will continue to monitor and support throughout remainder of shift.

## 2016-05-22 NOTE — Progress Notes (Signed)
Patient approached nurse asking to take her scheduled medication. Stating that she has taken this medication before and she is going to die. Encouragement given. Will continue to monitor.

## 2016-05-22 NOTE — Progress Notes (Signed)
Group Therapy Note    Date: 05/22/2016  Start Time:410  End Time: 445  Number of Participants: 9    Type of Group: Community Meeting    Wellness Binder Information  Module Name:  Meet and greet and positive mindset   Session Number: na    Patient's Goal: patient will be able to identify staff, rules of floor and ways to increase ones positive mindset.     Notes:  Patient polite and easily engaged in group.   Status After Intervention:  Improved    Participation Level: Active Listener and Interactive    Participation Quality: Appropriate, Attentive, Sharing and Supportive      Speech:  normal      Thought Process/Content: Logical      Affective Functioning: Congruent      Mood: euthymic      Level of consciousness:  Alert, Oriented x4 and Attentive      Response to Learning: Able to verbalize/acknowledge new learning, Able to retain information and Progressing to goal      Endings: None Reported    Modes of Intervention: Education, Support, Socialization, Exploration and Problem-solving      Discipline Responsible: Psychoeducational Specialist      Signature:  Higinio Grow W Shanine Kreiger, CTRS

## 2016-05-22 NOTE — Plan of Care (Signed)
Problem: Altered Mood, Psychotic Behavior  Goal: LTG-Able to verbalize decrease in frequency and intensity of hallucinations  Outcome: Ongoing  Mood is anxious and suspicious          Thoughts and comments are bizarre states she does not like nurses with blonde hair and blue eyes  Out on the unit but limited interaction with peers   Denies SI/HI  Will continue to monitor

## 2016-05-22 NOTE — Progress Notes (Signed)
Attended community meeting and shared goal as to learn about the grieving process.

## 2016-05-22 NOTE — Progress Notes (Signed)
Patient attended Wrap-up group. Daily goal was achieved.

## 2016-05-22 NOTE — Progress Notes (Signed)
Patient out on unit, uncooperative. Refusing medication. Will continue to monitor.

## 2016-05-22 NOTE — Plan of Care (Signed)
Problem: Altered Mood, Psychotic Behavior  Goal: STG-Medication therapy compliance  Outcome: Ongoing

## 2016-05-22 NOTE — Plan of Care (Signed)
[facility]  Initial Interdisciplinary Treatment Plan NOTE    Review Date & Time: 05/22/16 1200    Patient was not in treatment team    Admission Type:   Admission Type: Involuntary    Reason for admission:  Reason for Admission: MANIA, HOMICIDAL IDEATION, HALLUCINATIONS      Estimated Length of Stay Update:  3-5 days  Estimated Discharge Date Update: 5-7 days    PATIENT STRENGTHS:  Patient Strengths Strengths: Other, Social Skills  Patient Strengths and Limitations:Limitations: Lacks leisure interests, Difficulty problem solving/relies on others to help solve problems, Tendency to isolate self, Unrealistic self-view  Addictive Behavior:Addictive Behavior  In the past 3 months, have you felt or has someone told you that you have a problem with:  : None (NONE KNOWN)  Do you have a history of Chemical Use?: No  Do you have a history of Alcohol Use?: No  Do you have a history of Street Drug Abuse?: No  Histroy of Prescripton Drug Abuse?: No  Medical Problems:  Past Medical History:   Diagnosis Date   ??? Asthma    ??? Hypertension    ??? 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

## 2016-05-23 DIAGNOSIS — R45851 Suicidal ideations: Secondary | ICD-10-CM

## 2016-05-23 MED FILL — TRAZODONE HCL 50 MG PO TABS: 50 MG | ORAL | Qty: 1

## 2016-05-23 MED FILL — RISPERIDONE 1 MG PO TABS: 1 MG | ORAL | Qty: 1

## 2016-05-23 NOTE — Plan of Care (Signed)
Problem: Altered Mood, Depressive Behavior  Goal: LTG-Absence of self-harm  Outcome: Ongoing  11-7am shift note:  Pt observed to be asleep with easy even respirations at HS rounds since 2300.  RN hourly rounds provided to pt this shift.

## 2016-05-23 NOTE — Other (Signed)
05/23/16 1526   Encounter Summary   Services provided to: Patient   Referral/Consult From: Nurse;Patient   Complexity of Encounter Moderate   Spiritual Assessment Completed Yes   Routine   Type Initial   Spiritual/Religious   Type Spiritual support   Assessment Approachable;Calm;Hopeful   Intervention Explored feelings, thoughts, concerns;Prayer;Active listening;Discussed belief system/religious practices/faith;Discussed illness/injury and it's impact   Outcome Expressed gratitude;Comfort;Expressed feelings/needs/concerns;Engaged in conversation   Regina Nguyen has concerns for her husband not sure about the details.  She has a strong faith belief and grateful for the emotional and spiritual support.

## 2016-05-23 NOTE — Plan of Care (Signed)
Problem: Altered Mood, Depressive Behavior  Goal: LTG-Able to verbalize acceptance of life and situations over which he or she has no control  Outcome: Ongoing    Goal: STG-Able to verbalize support system  Outcome: Ongoing  Up and about. Social with select peers. Pleasant. Asking questions about court, met with lawyer who answered her questions. Denies harmful thoughts or hallucinations. Has been med compliant. States she has been looking for her husband but has not had any luck thus far. Behavior has been in control. Has been pleasant, coop. Will continue to monitor and assess

## 2016-05-23 NOTE — Plan of Care (Signed)
Problem: Anger Management/Homicidal Ideation  Goal: STG-Able to express anger through appropriate verbalization and healthy physical outlets  Outcome: Ongoing

## 2016-05-23 NOTE — Progress Notes (Signed)
DATE OF SERVICE:     05/23/2016    Regina Nguyen seen today for the purpose of continuation of care.  Nursing, social work reports, laboratory studies and vital signs are reviewed.      Patient chief complaint today is:             []  Depression      [x]  Anxiety        []  Psychosis         []  Suicidal/Homicidal                         []  Delusions           []  Aggression          Subjective:  Patient and I are meeting today for the first time.  She reviewed her history with me.  She denies symptoms with me.  Was behaving in a bizarre manner first 2 days of admission, and now is not.  Eating and sleeping well.  Denies depression, mania, psychosis.  Denies SI, HI, SIB and contracts for safety.  To continue current treatment.    Depression:  []  Mild []  Moderate []  Severe                []  Constant []  Sporadic     Anxiety: []  Mild [x]  Moderate []  Severe    []  Constant [x]  Sporadic     Delusions: []  Mild []  Moderate []  Severe     []  Constant []  Sporadic     []  Paranoid []  Somatic []  Grandiose     Hallucinations: []  Mild []  Moderate []  Severe     []  Constant []  Sporadic    []  Auditory  []  Visual []  Tactile       Suicidal: []  Constant []  Sporadic  Homicidal: []  Constant []  Sporadic    Unscheduled Medications     []  Patient Receiving Emergency Medications " Chemical Restraint"   [x]  Requesting PRN medications for anxiety    Psychiatric Review of systems  Delusions:  [x]  Denies []  Endorses   Withdrawals:  [x]  Denies []  Endorses    Hallucinations: [x]  Denies []  Endorses    Extra Pyramidal Symptoms: [x]  Denies []  Endorses      ROS:  [x]  All negative/unchanged except if checked. Explain positive(checked items) below:  []  Constitutional  []  Eyes  []  Ear/Nose/Mouth/Throat  []  Respiratory  []  CV  []  GI  []  GU  []  Musculoskeletal  []  Skin/Breast  []  Neurological  []  Endocrine  []  Heme/Lymph  []  Allergic/Immunologic      Mental Status Examination:    Cognition:  [x]  Alert  [x]  Awake  [x]  Oriented  [x]  Person  [x]  Place [x]   Time  Attention/Concentration:   [x]  Attentive  []  Distracted      Memory Recent and Remote:  []  Impaired []  Partially Impaired  [x]  Intact   Language: [x]  Able to recognize and name   []  Unable to recognize and name   Fund of Knowledge:  []  Poor []   Fair  [x]  Good    Speech:  [x]  Soft  [x]  Slow  []  Fast []  Pressured  []  Loud   Appearance: []  Well Groomed  [x]  Casual Dressed  []  Unkept  []  Disheveled          Attitude: [x]  Positive  []  Hostile  []  Demanding  []  Guarded  []  Defensive         [x]  Cooperative  []   Uncooperative  Behavior:  [x]  Normal Gait  []  Walks with Assistance  []  Geri Chair []  Walks with Walker  []  In Hospital Bed  []  Sitting in Chair  Muscle-Skeletal:  [x]  Normal Muscle Tone []  Muscle Atrophy       []  Abnormal Muscle Movement     Eye Contact:  []  Good eye contact  [x]  Intermittent Eye Contact  []  Poor Eye Contact     Mood: []  Depressed  [x]  Anxious  []  Irritated  []  Euthymic      Affect:  [x]  Congruent  []  Incongruent  []  Labile  []  Constricted  []  Flat  []  Bizarre     Thought Process and Association:  [x]  Logical []  Illogical       [x]  Linear and Goal Directed  []  Tangential  []  Circumstantial     Thought Content:  [x]  Denies []  Endorses []  Suicidal []  Homicidal  []  Delusional      []  Paranoid  []  Somatic       Perception: [x]   None  []  Auditory   []  Visual     Insight and Judgment  Fair    Assessment/Plan: Symptoms have abruptly stopped.  Question if patient was malingering.  To continue current treatment.       Patient Active Problem List   Diagnosis Code   ??? 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 Mild tetrahydrocannabinol (THC) abuse F12.10   ??? Suicidal ideation R45.851         Plan:    [x]  Improving as expected   []  Not improving as expected   []  Worsening    []   At Baseline     Signed:  Salena SanerJason [facility]  05/23/2016  1:28 PM

## 2016-05-23 NOTE — Progress Notes (Signed)
Date: 05/23/2016  Start Time: 11:00  End Time:  11:45  Number of Participants: 13    Type of Group: Psychotherapy    Wellness Binder Information  Module Name:    Session Number:     Patient's Goal:  Gain insight into interpersonal relationships by interacting with others    Notes:  Pt was able to express strong feelings regarding the stigma attached to mental illness and how relationships are impacted. Pt was given support and positive feed back.    Status After Intervention:  Improved    Participation Level: Interactive    Participation Quality: Attentive and Sharing      Speech:  Normal      Thought Process/Content: Logical      Affective Functioning: Congruent      Mood: anxious and depressed      Level of consciousness:  Attentive      Response to Learning: Able to verbalize/acknowledge new learning      Endings: None Reported    Modes of Intervention: Support and Socialization      Discipline Responsible: Social Worker/Counselor      Signature:  Bertin Inabinet, LISW

## 2016-05-23 NOTE — Progress Notes (Signed)
Spiritual Support Group Note    Number of Participants in Group:10                               Time: 2:00-2:45    Goal: Relief from isolation and loneliness             Faith Sharing             Self-understanding and gain insight              Acceptance and belonging            Recognize they are not alone                Socialization             Empowerment       Encouragement    Topic:  [x]  Spiritual Wellness and Self Care                  []  Hope                     [x]  Connecting with Divine/Others        []  Thankfulness and Gratitude               [x]   Meaningfulness and Purpose               []  Forgiveness               []  Peace               []  Connect to [employer]      []  Other      Participation Level:   []  Active Listener   []  Minimal   []  Monopolizing   [x]  Interactive   []  No Participation   []   Other:     Attention:   []  Alert   []  Distractible   [x]  Drowsy   []  Poor   []  Other:    Manner:   [x]  Cooperative   []  Suspicious   []  Withdrawn   []  Guarded   []  Irritable   []  Inhospitable   []  Other:         Others Comments from Group:

## 2016-05-23 NOTE — Other (Addendum)
Patient Acct Nbr:  [id number]  Primary AUTH/CERT:    Primary Insurance Company Name:   MEDICARE  Primary Insurance Plan Name:  [employer]  Primary Insurance Group Number:    [facility]  Primary Insurance Policy Number:  [id number]    Secondary AUTH/CERT:    Secondary Insurance Company Name:   WELFARE  Secondary Insurance Plan Name:  OUT OF STATE WELFARE  Secondary Insurance Group Number:    Secondary Insurance Plan Type: X  Secondary Insurance Policy Number:  [id number]

## 2016-05-23 NOTE — Progress Notes (Signed)
Patient attended community meeting. Patient identified goal for the day as: "Go to groups"                                                                         Group Therapy Note    Date: 05/23/2016  Start Time:  10:00  End Time:  10:45  Number of Participants: 11    Type of Group: Psychoeducation    Wellness Binder Information  Module Name:  Spiritual Wellness  Session Number:  1    Patient's Goal:  Patient will identify the role hope plays in recovery.     Notes:  Patient was interactive during group sharing the role hope plays in recovery. Patient gave support and feedback to others.     Status After Intervention:  Improved    Participation Level: Active Listener and Interactive    Participation Quality: Appropriate, Attentive, Sharing and Supportive      Speech:  normal      Thought Process/Content: Logical      Affective Functioning: Congruent      Mood: euthymic      Level of consciousness:  Alert, Oriented x4 and Attentive      Response to Learning: Able to verbalize current knowledge/experience, Able to verbalize/acknowledge new learning, Able to retain information, Capable of insight, Able to change behavior and Progressing to goal      Endings: None Reported    Modes of Intervention: Education, Support, Socialization, Exploration, Clarifying, Problem-solving and Activity      Discipline Responsible: Psychoeducational Specialist      Signature:  Lyman Bishopena M Jondavid Schreier, [address]

## 2016-05-23 NOTE — Plan of Care (Signed)
Problem: Anger Management/Homicidal Ideation  Goal: LTG-Absence of homicidal ideation  Outcome: Ongoing  Pt attended treatment team and talked about her possible discharge and that she would "do what she had to do to be discharged." Wanting to find out where her boyfriend is and that she was not close to his family. [place] not here. She denies and S/I, A/V hallucinations. In control. Polite. Compliant with her medications. Attended group.

## 2016-05-23 NOTE — Plan of Care (Signed)
[facility]  Day 3 Interdisciplinary Treatment Plan NOTE    Review Date & Time: 05/23/16 0800    Patient was in treatment team    Admission Type:   Admission Type: Involuntary    Reason for admission:  Reason for Admission: MANIA, HOMICIDAL IDEATION, HALLUCINATIONS  Estimated Length of Stay Update:  3-5 days  Estimated Discharge Date Update: 5-7 days    PATIENT STRENGTHS:  Patient Strengths Strengths: Other, Social Skills  Patient Strengths and Limitations:Limitations: Lacks leisure interests, Difficulty problem solving/relies on others to help solve problems, Tendency to isolate self, Unrealistic self-view  Addictive Behavior:Addictive Behavior  In the past 3 months, have you felt or has someone told you that you have a problem with:  : None (NONE KNOWN)  Do you have a history of Chemical Use?: No  Do you have a history of Alcohol Use?: No  Do you have a history of Street Drug Abuse?: No  Histroy of Prescripton Drug Abuse?: No  Medical Problems:  Past Medical History:   Diagnosis Date   ??? Asthma    ??? Hypertension    ??? 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 Changes to plan of Care  none    Status EXAM:   Status and Exam  Normal: Yes (Resting in bed apparently asleep)  Facial Expression: Brightened  Affect: Appropriate  Level of Consciousness: Alert  Mood:Normal: Yes  Mood: Anxious  Motor Activity:Normal: Yes  Motor Activity: Increased  Interview Behavior: Cooperative  Preception: Orient to Person, Orient to Time, Orient to Place, Orient to Situation  Attention:Normal: Yes  Attention: Distractible  Thought Processes: Other(See comment) (organized)  Thought Content:Normal: Yes  Thought Content: Preoccupations, Obsessions  Hallucinations: None  Delusions: No  Delusions: Obsessions  Memory:Normal: Yes  Memory: Confabulation  Insight and Judgment: No  Insight and  Judgment: Poor Judgment  Present Suicidal Ideation: No  Present Homicidal Ideation: No    Daily Assessment Last Entry:   Daily Sleep (WDL): Within Defined Limits         Patient Currently in Pain: Unable to Assess (patient sleeping)  Daily Nutrition (WDL): Within Defined Limits    Patient Monitoring:  Frequency of Checks: 4 times per hour, close    Psychiatric Symptoms:   Depression Symptoms  Depression Symptoms: No problems reported or observed.  Anxiety Symptoms  Anxiety Symptoms: Generalized  Mania Symptoms  Mania Symptoms: Flight of ideas, Poor judgment     Psychosis Symptoms  Delusion Type: No problems reported or observed.    Suicide Risk CSSR-S:  1) Within the past month, have you wished you were dead or wished you could go to sleep and not wake up? : NO  2) Within the past month, have you actually had any thoughts of killing yourself? : YES  3) Within the past month, have you been thinking about how you might kill yourself? : YES  5) Within the past month, have you started to work out or worked out the details of how to kill yourself? Do you intend to carry out this plan? : YES  6)  Have you ever done anything, started to do anything, or prepared to do anything to end your life?: NO  Change in Result none Change in Plan of care none      EDUCATION:   Learner Progress  Toward Treatment Goals: Reviewed results and recommendations of this team    Method: Small group    Outcome: Verbalized understanding    PATIENT GOALS: "have patience"    PLAN/TREATMENT RECOMMENDATIONS UPDATE:day 7    SHORT-TERM GOALS UPDATE:   Time frame for Short-Term Goals: 3-5 days    LONG-TERM GOALS UPDATE:   Time frame for Long-Term Goals: 5-7 days  Members Present in Team Meeting: See Signature Sheet    Lesia Sagoonnie S Jarious Lyon, RN

## 2016-05-23 NOTE — Progress Notes (Signed)
Group Therapy Note    Date: 05/23/2016  Start Time: 105  End Time:  155  Number of Participants: 7    Type of Group: Geophysicist/field seismologistCognitive Skills    Wellness Binder Information  Module Name:  Happiness and Gratitude  Session Number:      Patient's Goal:  Pt will be able to identify 3 activities that can create happiness and will be able to identify 3 things they can be grateful for.    Notes:  Pt was active in class discussion.     Status After Intervention:  Improved    Participation Level: Active Listener and Interactive    Participation Quality: Appropriate, Attentive, Sharing and Supportive      Speech:  normal      Thought Process/Content: Logical      Affective Functioning: Blunted      Mood: depressed      Level of consciousness:  Alert, Oriented x4 and Attentive      Response to Learning: Able to verbalize current knowledge/experience, Able to verbalize/acknowledge new learning and Progressing to goal      Endings: None Reported    Modes of Intervention: Education, Support, Socialization and Exploration      Discipline Responsible: Social Worker/Counselor

## 2016-05-23 NOTE — Progress Notes (Signed)
Group Therapy Note    Date: 05/23/2016  Start Time: 4:00  End Time:  4:45  Number of Participants: 7    Type of Group: Healthy Living/Wellness    Wellness Binder Information  Module Name:  Chicken Soup for [employer] game  Session Number:  n/a    Patient's Goal:  Patient will build therapeutic relationships with peers through activity.     Notes:  Patient was interactive during group participating in activity and built trusting relationships with peers. Patient gave support and feedback to others.     Status After Intervention:  Improved    Participation Level: Active Listener and Interactive    Participation Quality: Appropriate, Attentive, Sharing and Supportive      Speech:  normal      Thought Process/Content: Logical      Affective Functioning: Congruent      Mood: euthymic      Level of consciousness:  Alert, Oriented x4 and Attentive      Response to Learning: Able to verbalize current knowledge/experience, Able to verbalize/acknowledge new learning, Able to retain information, Capable of insight, Able to change behavior and Progressing to goal      Endings: None Reported    Modes of Intervention: Education, Support, Socialization, Exploration, Clarifying, Problem-solving and Activity      Discipline Responsible: Psychoeducational Specialist      Signature:  Lyman Bishopena M Jake Fuhrmann, [address]

## 2016-05-24 MED FILL — RISPERIDONE 1 MG PO TABS: 1 MG | ORAL | Qty: 1

## 2016-05-24 MED FILL — TRAZODONE HCL 50 MG PO TABS: 50 MG | ORAL | Qty: 1

## 2016-05-24 NOTE — Plan of Care (Signed)
Problem: Altered Mood, Depressive Behavior  Goal: STG-Knowledge of positive coping patterns  Outcome: Ongoing

## 2016-05-24 NOTE — Progress Notes (Signed)
Group Therapy Note    Date: 05/24/2016  Start Time: 11:00 AM  End Time:  12:00 PM    Number of Participants: 8    Type of Group: Psychotherapy    Wellness Binder Information  Module Name:    Session Number:      Patient's Goal:  Patient will demonstrate appropriate interaction, communication, and develop insights of ways to improve interaction with others. Patient will demonstrate ways to engage in healthy interactions to manage moods and develop insights into ways to improve interactions with others.    Notes:  Pt was able to express strong feelings re: support system. Patient was given positive support from group. Focus of the group ways on ways to increase positive communication and improve relationships.    Status After Intervention:  Improved    Participation Level: Active Listener and Interactive    Participation Quality: Appropriate, Attentive, Sharing and Supportive      Speech:  Normal      Thought Process/Content: Logical      Affective Functioning: Congruent      Mood: anxious and depressed      Level of consciousness:  Alert, Attentive, and Oriented x4      Response to Learning: Able to verbalize current knowledge/experience, Able to verbalize/acknowledge new learning, Able to retain information and Capable of insight      Endings: None Reported    Modes of Intervention: Support and Socialization      Discipline Responsible: Social Worker/Counselor      Signature:  Alda Ponderebbie Komal Stangelo, LISW-S

## 2016-05-24 NOTE — Plan of Care (Signed)
Problem: Altered Mood, Manic Behavior  Goal: LTG-Appropriate social interaction  Outcome: Ongoing

## 2016-05-24 NOTE — Plan of Care (Signed)
Problem: Altered Mood, Psychotic Behavior  Goal: LTG-Appropriate social interaction  Outcome: Ongoing  UP AND ABOUT UNIT. DENIES SUICIDAL IDEATIONS OR INTENT TO HARM SELF OR OTHERS. DENIES HALLUCINATIONS. NO VOICED DELUSIONS. VOICING CONCERNS ABOUT HAVING TO STAY HERE BECAUSE OF THE  COURT HEARING.

## 2016-05-24 NOTE — Progress Notes (Signed)
Patient identified goal for the day as: "Have patience and hope to go home"                                                                         Group Therapy Note    Date: 05/24/2016  Start Time: 10:00  End Time:  10:30  Number of Participants: 6    Type of Group: Psychoeducation    Wellness Binder Information  Module Name:  What "stuck" with you  Session Number:  n/a    Patient's Goal:  Patient will identify what resources have "stuck" with them during this treatment stay.     Notes:  Patient was interactive during group sharing what resources have "stuck" during this treatment stay.     Status After Intervention:  Improved    Participation Level: Active Listener and Interactive    Participation Quality: Appropriate, Attentive, Sharing and Supportive      Speech:  normal      Thought Process/Content: Logical      Affective Functioning: Congruent      Mood: depressed      Level of consciousness:  Alert, Oriented x4 and Attentive      Response to Learning: Able to verbalize current knowledge/experience, Able to verbalize/acknowledge new learning, Able to retain information, Capable of insight, Able to change behavior and Progressing to goal      Endings: None Reported    Modes of Intervention: Education, Support, Socialization, Exploration, Clarifying, Problem-solving and Activity      Discipline Responsible: Psychoeducational Specialist      Signature:  Lyman Bishopena M Loretta Doutt, [address]

## 2016-05-24 NOTE — Behavioral Health Treatment Team (Signed)
I am being asked to see this patient due to probate court hearing today.    S- Patient reports improved mood and affect, has been more social out on the unit.  Denies SI.  Denies HI.  Denies AVH.  Denies delusions.  Less religiously preoccupied.  Denies mania.  Denies psychosis.  Sleep has improved.  No pressured speech.  Less irritable.  Still has poor insight but more accepting of diagnosis.      O- Patient more calm and cooperative today, compliant with medications.  Denies SI.  Denies HI.  Less religiously preoccupied.  Does not appear internally stimulated.  AA and O x 4.    A- Bipolar 1 disorder manic severe with psychosis    P- Continue inpatient admission, patient improving, still with poor insight requires inpatient admission for safety.

## 2016-05-24 NOTE — Plan of Care (Signed)
Problem: Altered Mood, Psychotic Behavior  Goal: LTG-Able to verbalize reality based thinking  Outcome: Ongoing  Mood is anxious but otherwise thoughts are clear states she was crazy for having herself admitted just to help her husband because he did not get help and she is here   Denies SI/HI  Hopeful for discharge soon  States she will return to [place] and hopefully find her husband  Will continue to monitor

## 2016-05-24 NOTE — Plan of Care (Signed)
Problem: Anger Management/Homicidal Ideation  Goal: STG-Able to express anger through appropriate verbalization and healthy physical outlets  Outcome: Ongoing

## 2016-05-24 NOTE — Progress Notes (Signed)
Patient has been up in unit, talking with peers. Pleasant and cooperative. Compliant with medications this AM. No unit issues at this time.    Electronically signed by Alvia GroveAlyssa K Ragna Kramlich, RN on 05/24/2016 at 11:59 AM

## 2016-05-24 NOTE — Care Coordination-Inpatient (Addendum)
Population Navigator attended [employer] Hearing, based upon testimony provided Magistrate found client is mentally ill and subject to hospitalization.  Client has been taking her medications for past two days, therefore, the forced medication request was not heard.  Client understands that the forced medication request can be re-filed if warranted.   Electronically signed by Lowell GuitarMichelle Michial Disney, LPCC on 05/24/2016 at 10:54 AM

## 2016-05-24 NOTE — Progress Notes (Signed)
DATE OF SERVICE:     05/24/2016    Regina Nguyen seen today for the purpose of continuation of care.  Nursing, social work reports, laboratory studies and vital signs are reviewed.      Patient chief complaint today is:             []  Depression      [x]  Anxiety        []  Psychosis         []  Suicidal/Homicidal                         []  Delusions           []  Aggression          Subjective:  The patient reports improved mood, less anxiety and denies psychosis.  Denies SI, HI, SIB and contracts for safety.  Eating and sleeping well.  Tolerating medications without side effects.  To continue medications.    Depression:  []  Mild []  Moderate []  Severe                []  Constant []  Sporadic     Anxiety: []  Mild [x]  Moderate []  Severe    []  Constant [x]  Sporadic     Delusions: []  Mild []  Moderate []  Severe     []  Constant []  Sporadic     []  Paranoid []  Somatic []  Grandiose     Hallucinations: []  Mild []  Moderate []  Severe     []  Constant []  Sporadic    []  Auditory  []  Visual []  Tactile       Suicidal: []  Constant []  Sporadic  Homicidal: []  Constant []  Sporadic    Unscheduled Medications     []  Patient Receiving Emergency Medications " Chemical Restraint"   [x]  Requesting PRN medications for anxiety    Psychiatric Review of systems  Delusions:  [x]  Denies []  Endorses   Withdrawals:  [x]  Denies []  Endorses    Hallucinations: [x]  Denies []  Endorses    Extra Pyramidal Symptoms: [x]  Denies []  Endorses      ROS:  [x]  All negative/unchanged except if checked. Explain positive(checked items) below:  []  Constitutional  []  Eyes  []  Ear/Nose/Mouth/Throat  []  Respiratory  []  CV  []  GI  []  GU  []  Musculoskeletal  []  Skin/Breast  []  Neurological  []  Endocrine  []  Heme/Lymph  []  Allergic/Immunologic      Mental Status Examination:    Cognition:  [x]  Alert  [x]  Awake  [x]  Oriented  [x]  Person  [x]  Place [x]  Time  Attention/Concentration:   [x]  Attentive  []  Distracted      Memory Recent and Remote:  []  Impaired []  Partially Impaired   [x]  Intact   Language: [x]  Able to recognize and name   []  Unable to recognize and name   Fund of Knowledge:  []  Poor []   Fair  [x]  Good    Speech:  [x]  Soft  [x]  Slow  []  Fast []  Pressured  []  Loud   Appearance: []  Well Groomed  [x]  Casual Dressed  []  Unkept  []  Disheveled          Attitude: [x]  Positive  []  Hostile  []  Demanding  []  Guarded  []  Defensive         [x]  Cooperative  []   Uncooperative    Behavior:  [x]  Normal Gait  []  Walks with Assistance  []  Geri Chair []  Walks with Walker  []  In Hospital Bed  []  Sitting  in Chair  Muscle-Skeletal:  [x]  Normal Muscle Tone []  Muscle Atrophy       []  Abnormal Muscle Movement     Eye Contact:  [x]  Good eye contact  []  Intermittent Eye Contact  []  Poor Eye Contact     Mood: []  Depressed  [x]  Anxious  []  Irritated  []  Euthymic      Affect:  [x]  Congruent  []  Incongruent  []  Labile  []  Constricted  []  Flat  []  Bizarre     Thought Process and Association:  [x]  Logical []  Illogical       [x]  Linear and Goal Directed  []  Tangential  []  Circumstantial     Thought Content:  [x]  Denies []  Endorses []  Suicidal []  Homicidal  []  Delusional      []  Paranoid  []  Somatic       Perception: [x]   None  []  Auditory   []  Visual     Insight and Judgment  Fair    Assessment/Plan: Patient denies symptoms.  To continue current medications.       Patient Active Problem List   Diagnosis Code   ??? 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 Mild tetrahydrocannabinol (THC) abuse F12.10   ??? Suicidal ideation R45.851         Plan:    [x]  Improving as expected   []  Not improving as expected   []  Worsening    []   At Baseline     Signed:  Salena Saner [facility]  05/24/2016  10:12 AM

## 2016-05-24 NOTE — Plan of Care (Signed)
Problem: Altered Mood, Manic Behavior  Goal: LTG-Increase control over impulses  Outcome: Ongoing  11-7am shift note:  Pt observed to be asleep with easy even respirations at HS rounds since 2300.  RN hourly rounds provided to pt this shift.

## 2016-05-25 LAB — CANNABINOID, URINE, CONFIRMATION: Cannabinoids Conf Ur: 220 ng/mL

## 2016-05-25 MED FILL — RISPERIDONE 1 MG PO TABS: 1 MG | ORAL | Qty: 1

## 2016-05-25 MED FILL — TRAZODONE HCL 50 MG PO TABS: 50 MG | ORAL | Qty: 1

## 2016-05-25 NOTE — Plan of Care (Signed)
Problem: Altered Mood, Depressive Behavior  Goal: LTG-Able to verbalize acceptance of life and situations over which he or she has no control  Outcome: Ongoing    Goal: STG-Able to verbalize suicidal ideations  Outcome: Ongoing      Comments: Pt. Denies SI, HI, and hallucinations this shift. Pt. Denies any physical complaints currently.

## 2016-05-25 NOTE — Behavioral Health Treatment Team (Signed)
DATE OF SERVICE:     05/25/2016    Regina Nguyen seen today for the purpose of continuation of care.  Nursing, social work reports, laboratory studies and vital signs are reviewed.      Patient chief complaint today is:             [x]  Depression      []  Anxiety        [x]  Psychosis         []  Suicidal/Homicidal                         []  Delusions           []  Aggression          Subjective:     Today patient states that she is doing good and meds are helping her.    Sleep:  [x]  Good []  Fair  []  Poor  Appetite:  [x]  Good []  Fair  []  Poor    Depression:  []  Mild []  Moderate []  Severe                []  Constant []  Sporadic     Anxiety: []  Mild []  Moderate []  Severe    []  Constant []  Sporadic     Delusions: []  Mild []  Moderate []  Severe     []  Constant []  Sporadic     []  Paranoid []  Somatic []  Grandiose     Hallucinations: []  Mild []  Moderate []  Severe     []  Constant []  Sporadic    []  Auditory  []  Visual []  Tactile       Suicidal: []  Constant []  Sporadic  Homicidal: []  Constant []  Sporadic    Unscheduled Medications     []  Patient Receiving Emergency Medications " Chemical Restraint"   []  Requesting PRN medications for anxiety    Psychiatric Review of systems  Delusions:  [x]  Denies []  Endorses   Withdrawals:  [x]  Denies []  Endorses    Hallucinations: [x]  Denies []  Endorses    Extra Pyramidal Symptoms: [x]  Denies []  Endorses      Medical Review of Systems:     All other than marked systmes have been reviewed and are all negative.    Constitutional Symptoms: []   fever []   Chills  Skin Symptoms: []  rash []   Pruritus   Eye Symptoms: []  Vision unchanged []   recent vision problems[]  blurred vision   Respiratory Symptoms:[]  shortness of breath []  cough  Cardiovascular Symptoms:  []  chest pain   []  palpitations   Gastrointestinal Symptoms: []   abdominal pain []   nausea []   vomiting []   diarrhea  Genitourinary Symptoms: []   dysuria  []   hematuria   Musculoskeletal Symptoms: []   back pain []   muscle pain []   joint  pain  Neurologic Symptoms: []   headache []   dizziness  Hematolymphoid Symptoms: []  Adenopathy []  Bruises   []  Schimosis       Mental Status Examination:    Cognition:      [x]  Alert  [x]  Awake  [x]  Oriented  [x]  Person  [x]  Place [x]  Time      []  drowsy  []  tired  []  lethargic  []  distractable  []  Other     Attention/Concentration:   [x]  Attentive  []  Distracted        Memory Recent and Remote: [x]  Intact   []  Impaired []  Partially Impaired     Language: [x]  Able to recognize and name objects          []   Unable to recognize and name Objects    Fund of Knowledge:  []  Poor [x]   Fair  []  Good    Speech: [x]  Normal  [x]  Soft  []  Slow  []  Fast []  Pressured            []  Loud []  Dysarthria  []  Incoherent    Appearance: []  Well Groomed  [x]  Casual Dressed  []  Unkept  []  Disheveled          []  Normal weight[]  Thin  []  Overweight  []  Obese           Attitude: [x]  Positive  []  Hostile  []  Demanding  []  Guarded  []  Defensive         []  Cooperative  []   Uncooperative      Behavior:  [x]  Normal Gait  []  Walks with Assistance  []  Geri Chair    []  Walks with Walker  []  In Hospital Bed  []  Sitting in Chair    Muscle-Skeletal:  []  Normal Muscle Tone []  Muscle Atrophy       []  Abnormal Muscle Movement     Eye Contact:  [x]  Good eye contact  []  Intermittent Eye Contact  []  Poor Eye Contact     Mood: []  Depressed  []  Anxious  []  Irritated  []  Euthymic   []  Angry []  Restless    Affect:  [x]  Congruent  []  Incongruent  []  Labile  []  Constricted  []  Flat  []  Bizarre     Thought Process and Association:  [x]  Logical []  Illogical       []  Linear and Goal Directed  []  Tangential  []  Circumstantial     Thought Content:  [x]  Denies []  Endorses []  Suicidal []  Homicidal  []  Delusional      []  Paranoid  []  Somatic  []  Grandiose    Perception: [x]   None  []  Auditory   []  Visual  []  tactile   []  olfactory  []  Illusions         Insight: []  Intact  [x]  Fair  []  Limited    Judgement:  []  Intact  [x]  Fair  []  Limited      Assessment/Plan:        Patient  Active Problem List   Diagnosis Code   ??? 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 Mild tetrahydrocannabinol (THC) abuse F12.10   ??? Suicidal ideation R45.851         Plan:    []   Patient is refusing medications  [x]  Improving as expected   []  Not improving as expected   []  Worsening    []   At Baseline       Reason for more than one antipsychotic:  []  N/A  []  3 failed monotherapy(drugs tried):  []  Cross over to a new antipsychotic  []  Taper to monotherapy from polypharmacy  []  Augmentation of Clozapine therapy due to treatment resistance to single therapy      Signed:  Crystelle Ferrufino Mahboob Amardeep Beckers  05/25/2016  10:13 PM

## 2016-05-25 NOTE — Plan of Care (Signed)
Problem: Altered Mood, Depressive Behavior  Goal: LTG-Absence of self-harm  Outcome: Met This Shift  11-7am shift note:  Pt observed to be asleep with easy even respirations at HS rounds since 2300.  RN hourly rounds provided to pt this shift.

## 2016-05-25 NOTE — Plan of Care (Signed)
Problem: Altered Mood, Psychotic Behavior  Goal: LTG-Able to verbalize decrease in frequency and intensity of hallucinations  Outcome: Met This Shift    Goal: LTG-Able to verbalize reality based thinking  Outcome: Met This Shift      Comments: Has been out and about the unit.  Behavior has been appropriate and thoughts are organized.  Denies any harmful ideations and hallucinations.

## 2016-05-25 NOTE — Progress Notes (Signed)
Attended community meeting and shared goal for the day as to work on decreasing my anger.     ??                                                                      Group Therapy Note  ??  Date: 05/25/2016  Start Time: 1015  End Time:  1100  Number of Participants: 9  ??  Type of Group: Psychoeducation  ??  Wellness Binder Information  Module Name:  Dimensions of Wellness  Session Number:  NA  Patient's Goal: Patient will be able to identify dimensions of wellness, and what he/she is doing well in each category and what he/she could do better.   ??  Notes:  Patient polite and engaged in group session.   Status After Intervention:  Improved  ??  Participation Level: Active Listener and Interactive  ??  Participation Quality: Appropriate, Attentive and Sharing  ??  ??  Speech:  normal  ??  ??  Thought Process/Content: Logical  ??  ??  Affective Functioning: Congruent  ??  ??  Mood: euthymic  ??  ??  Level of consciousness:  Alert, Oriented x4 and Attentive  ??  ??  Response to Learning: Able to verbalize/acknowledge new learning, Able to retain information and Progressing to goal  ??  ??  Endings: None Reported  ??  Modes of Intervention: Education, Support, Socialization, Exploration and Problem-solving  ??  ??  Discipline Responsible: Psychoeducational Specialist  ??  ??  Signature:  Jenna LuoKelly W Rubens [place], CTRS

## 2016-05-26 MED ORDER — QUETIAPINE FUMARATE 100 MG PO TABS
100 MG | Freq: Every evening | ORAL | Status: DC
Start: 2016-05-26 — End: 2016-05-28
  Administered 2016-05-27 – 2016-05-28 (×2): 200 mg via ORAL

## 2016-05-26 MED ORDER — RISPERIDONE 2 MG PO TABS
2 MG | Freq: Two times a day (BID) | ORAL | Status: DC
Start: 2016-05-26 — End: 2016-05-28
  Administered 2016-05-27 – 2016-05-28 (×3): 2 mg via ORAL

## 2016-05-26 MED FILL — SEROQUEL 100 MG PO TABS: 100 MG | ORAL | Qty: 2

## 2016-05-26 MED FILL — RISPERIDONE 2 MG PO TABS: 2 MG | ORAL | Qty: 1

## 2016-05-26 MED FILL — TRAZODONE HCL 50 MG PO TABS: 50 MG | ORAL | Qty: 1

## 2016-05-26 MED FILL — QUETIAPINE FUMARATE 100 MG PO TABS: 100 MG | ORAL | Qty: 2

## 2016-05-26 NOTE — Plan of Care (Signed)
Problem: Altered Mood, Depressive Behavior  Goal: LTG-Absence of self-harm  Outcome: Ongoing  11-7am shift note:  Pt observed to be asleep with easy even respirations at HS rounds since 2300.  RN hourly rounds provided to pt this shift.

## 2016-05-26 NOTE — Plan of Care (Signed)
Problem: Altered Mood, Psychotic Behavior  Goal: LTG-Able to verbalize decrease in frequency and intensity of hallucinations  Outcome: Met This Shift    Goal: LTG-Appropriate social interaction  Outcome: Met This Shift      Comments: Has been out and about the unit and behavior remains appropriate.  Also interacting appropriately.  Pleasant and cooperative on approach.  Denies any suicidal ideations and hallucinations.

## 2016-05-26 NOTE — Behavioral Health Treatment Team (Signed)
DATE OF SERVICE:     05/26/2016    Regina Nguyen seen today for the purpose of continuation of care.  Nursing, social work reports, laboratory studies and vital signs are reviewed.      Per nursing report, she was religiously preoccupied.     Patient chief complaint today is:      Poor sleep       []  Depression      []  Anxiety        []  Psychosis         []  Suicidal/Homicidal                         []  Delusions           []  Aggression          Subjective:     Today patient states that    Sleep:  []  Good []  Fair  [x]  Poor  Appetite:  []  Good [x]  Fair  []  Poor    Depression:  []  Mild []  Moderate []  Severe                []  Constant []  Sporadic     Anxiety: []  Mild []  Moderate []  Severe    []  Constant []  Sporadic     Delusions: []  Mild []  Moderate []  Severe     []  Constant []  Sporadic     []  Paranoid []  Somatic []  Grandiose     Hallucinations: []  Mild []  Moderate []  Severe     []  Constant []  Sporadic    []  Auditory  []  Visual []  Tactile       Suicidal: []  Constant []  Sporadic  Homicidal: []  Constant []  Sporadic    Unscheduled Medications     []  Patient Receiving Emergency Medications " Chemical Restraint"   []  Requesting PRN medications for anxiety    Psychiatric Review of systems  Delusions:  [x]  Denies []  Endorses   Withdrawals:  [x]  Denies []  Endorses    Hallucinations: [x]  Denies []  Endorses    Extra Pyramidal Symptoms: [x]  Denies []  Endorses      Medical Review of Systems:     All other than marked systmes have been reviewed and are all negative.    Constitutional Symptoms: []   fever []   Chills  Skin Symptoms: []  rash []   Pruritus   Eye Symptoms: []  Vision unchanged []   recent vision problems[]  blurred vision   Respiratory Symptoms:[]  shortness of breath []  cough  Cardiovascular Symptoms:  []  chest pain   []  palpitations   Gastrointestinal Symptoms: []   abdominal pain []   nausea []   vomiting []   diarrhea  Genitourinary Symptoms: []   dysuria  []   hematuria   Musculoskeletal Symptoms: []   back pain []   muscle pain  []   joint pain  Neurologic Symptoms: []   headache []   dizziness  Hematolymphoid Symptoms: []  Adenopathy []  Bruises   []  Schimosis       Mental Status Examination:    Cognition:      [x]  Alert  [x]  Awake  [x]  Oriented  [x]  Person  [x]  Place [x]  Time      []  drowsy  []  tired  []  lethargic  []  distractable  []  Other     Attention/Concentration:   []  Attentive  [x]  Distracted        Memory Recent and Remote: []  Intact   []  Impaired []  Partially Impaired     Language: []  Able to recognize and name  objects          []  Unable to recognize and name Objects    Fund of Knowledge:  []  Poor []   Fair  []  Good    Speech: [x]  Normal  []  Soft  []  Slow  []  Fast []  Pressured            []  Loud []  Dysarthria  []  Incoherent    Appearance: []  Well Groomed  [x]  Casual Dressed  []  Unkept  []  Disheveled          []  Normal weight[]  Thin  []  Overweight  []  Obese           Attitude: [x]  Positive  []  Hostile  []  Demanding  []  Guarded  []  Defensive         [x]  Cooperative  []   Uncooperative      Behavior:  [x]  Normal Gait  []  Walks with Assistance  []  Geri Chair    []  Walks with Walker  []  In Hospital Bed  []  Sitting in Chair    Muscle-Skeletal:  []  Normal Muscle Tone []  Muscle Atrophy       []  Abnormal Muscle Movement     Eye Contact:  [x]  Good eye contact  []  Intermittent Eye Contact  []  Poor Eye Contact     Mood: []  Depressed  []  Anxious  []  Irritated  [x]  Euthymic   []  Angry []  Restless    Affect:  [x]  Congruent  []  Incongruent  []  Labile  []  Constricted  []  Flat  []  Bizarre     Thought Process and Association:  []  Logical []  Illogical       [x]  Linear and Goal Directed  []  Tangential  []  Circumstantial     Thought Content:  []  Denies []  Endorses []  Suicidal []  Homicidal  []  Delusional      []  Paranoid  []  Somatic  [x]  Grandiose    Perception: [x]   None  []  Auditory   []  Visual  []  tactile   []  olfactory  []  Illusions         Insight: []  Intact  []  Fair  [x]  Limited    Judgement:  []  Intact  []  Fair  [x]  Limited      Assessment/Plan:         Patient Active Problem List   Diagnosis Code   ??? 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 Mild tetrahydrocannabinol (THC) abuse F12.10   ??? Suicidal ideation R45.851         Plan:     1. Will restart Seroquel 200 mg PO QHS    []   Patient is refusing medications  [x]  Improving as expected   []  Not improving as expected   []  Worsening    []   At Baseline       Reason for more than one antipsychotic:  []  N/A  []  3 failed monotherapy(drugs tried):  []  Cross over to a new antipsychotic  []  Taper to monotherapy from polypharmacy  []  Augmentation of Clozapine therapy due to treatment resistance to single therapy      Signed:  Arlet Marter Mahboob Maryjayne Kleven  05/26/2016  9:54 PM

## 2016-05-26 NOTE — Progress Notes (Signed)
Attended community meeting and shared goal for the day as to talk to my nurse about some things.                                                                            Group Therapy Note    Date: 05/26/2016  Start Time: 1015  End Time: 1115  Number of Participants: 10    Type of Group: Psychoeducation    Wellness Binder Information  Module Name:  Coping skills  Session Number:  na    Patient's Goal: patient will be able to identify types of coping skills encouraged for everyone.     Notes: Patient polite and easily engaged in group sharing what has worked for them in the past.     Status After Intervention:  Improved    Participation Level: Active Listener and Interactive    Participation Quality: Appropriate, Attentive, Sharing and Supportive      Speech:  normal      Thought Process/Content: Logical      Affective Functioning: Congruent      Mood: euthymic      Level of consciousness:  Alert, Oriented x4 and Attentive      Response to Learning: Able to verbalize/acknowledge new learning, Able to retain information and Progressing to goal      Endings: None Reported    Modes of Intervention: Education, Support, Socialization, Exploration and Problem-solving      Discipline Responsible: Psychoeducational Specialist      Signature:  Jenna LuoKelly W Elleanor [place], [address]

## 2016-05-27 MED FILL — RISPERIDONE 2 MG PO TABS: 2 MG | ORAL | Qty: 1

## 2016-05-27 MED FILL — TRAZODONE HCL 50 MG PO TABS: 50 MG | ORAL | Qty: 1

## 2016-05-27 MED FILL — SEROQUEL 100 MG PO TABS: 100 MG | ORAL | Qty: 2

## 2016-05-27 MED FILL — QUETIAPINE FUMARATE 100 MG PO TABS: 100 MG | ORAL | Qty: 2

## 2016-05-27 NOTE — Progress Notes (Signed)
Attended community meeting and shared goal for the day as to go to group today and talk to the Child psychotherapistsocial worker.     Attended community meeting and shared goal for the day as to visit with my boyfriend today.                                                                           Group Therapy Note    Date: 05/27/2016  Start Time: 1000  End Time:  1045  Number of Participants: 11    Type of Group: Psychoeducation    Wellness Binder Information  Module Name:  Happiness  Session Number:  5-3    Patient's Goal: Patient will be able to identify benefits of being happy and 3 practices one can complete to increase his/her own happiness.                                                                      Notes:  Patient polite and sharing in group, willing to share what she is grateful for.     Status After Intervention:  Improved    Participation Level: Active Listener and Interactive    Participation Quality: Appropriate, Attentive and Sharing      Speech:  normal      Thought Process/Content: Logical      Affective Functioning: Congruent      Mood: euthymic      Level of consciousness:  Alert, Oriented x4 and Attentive      Response to Learning: Able to verbalize/acknowledge new learning, Able to retain information and Progressing to goal      Endings: None Reported    Modes of Intervention: Education, Support, Socialization, Exploration and Problem-solving      Discipline Responsible: Psychoeducational Specialist      Signature:  Jenna LuoKelly W [place], [address]

## 2016-05-27 NOTE — Progress Notes (Signed)
Date: 05/27/2016  Start Time: 2;00  End Time:  2:45  Number of Participants: 11    Type of Group: Recovery    Wellness Binder Information  Module Name:  Anger  Mangement  Session Number:     Patient's Goal:  Pt will learn to explore what's really behind their angry,warningsigns, triggers and ways to calm down.    Notes:  Pt will    Status After Intervention:  Improved    Participation Level: Interactive    Participation Quality: Attentive and Sharing      Speech Normal  Pt will  lbe able to acknowledge and verbalized new learning    Thought Process/Content: Logical      Affective Functioning: Flat      Mood: anxious and depressed      Level of consciousness:  Attentive      Response to Learning: Able to verbalize current knowledge/experience      Endings: None Reported    Modes of Intervention: Support and Socialization      Discipline Responsible: Social Worker/Counselor      Signature:  Alik Mawson, LISW

## 2016-05-27 NOTE — Progress Notes (Signed)
Group Therapy Note    Date: 05/27/2016  Start Time: 105  End Time:  155  Number of Participants: 12    Type of Group: Cognitive Skills    Wellness Binder Information  Module Name:  Good and Bad communication principles  Session Number:      Patient's Goal:  To be able to identify bad communication principles that they engage in and identify positive communication principles they could use instead.     Notes:  Pt was active in class discussion.     Status After Intervention:  Improved    Participation Level: Active Listener and Interactive    Participation Quality: Appropriate and Attentive      Speech:  normal      Thought Process/Content: Logical      Affective Functioning: Blunted      Mood: depressed      Level of consciousness:  Alert, Oriented x4 and Attentive      Response to Learning: Able to verbalize current knowledge/experience, Able to verbalize/acknowledge new learning and Progressing to goal      Endings: None Reported    Modes of Intervention: Education, Support, Socialization, Exploration and Problem-solving      Discipline Responsible: Social Worker/Counselor

## 2016-05-27 NOTE — Progress Notes (Signed)
Patient attended Wrap-up group. Daily goal was achieved.

## 2016-05-27 NOTE — Plan of Care (Signed)
Problem: Altered Mood, Manic Behavior  Goal: LTG-Increase control over impulses  Outcome: Ongoing  11-7am shift note:  Pt observed to be asleep with easy even respirations at HS rounds since 2300.  RN hourly rounds provided to pt this shift.

## 2016-05-27 NOTE — Plan of Care (Signed)
Problem: Altered Mood, Manic Behavior  Goal: LTG-Appropriate social interaction  Outcome: Met This Shift      Problem: Altered Mood, Depressive Behavior  Goal: STG-Knowledge of positive coping patterns  Outcome: Ongoing      Comments: Patient's greatest concern today is coordinating discharge and travel arrangements. Her husband is being discharged from a facility in [place] and they need to travel back to [place].     Patient up and active on unit, appropriately groomed and attired. Patient is bright, friendly and sociable. Positive and appropriate interactions with staff and peers. Calm and cooperative with staff and care.    Denies thoughts or intent to harm self or others at this time. Denies audio or visual hallucinations at this time.     Reports no concerns or complaints, no signs or symptoms of distress or discomfort. Able to maintain control of behaviors and emotions.     Patient is encouraged to continue to work towards discharge goal by complying with medications, attending groups and to seek staff if feelings are overwhelming. Environmental rounds completed per unit policy to maintain safety of everyone on the unit. Staff will offer support and interventions as requested or required.

## 2016-05-27 NOTE — Care Coordination-Inpatient (Signed)
SW Note: d/c planning: SW met with pt and we discussed d/c planning. She has court hearing in [place] on [place] where she lives and needs to get home. She only has $89 on her but states she can pay [place] back when she comes up next month. She is looking at relocating here. SW googled [employer] fares to [place], [place] and fare is $142 before tax. Pt attends [facility] there and  Missed her psychiatry appt today. SW and pt called there today to cancel and reschedule. They are to call back with appt time and date for psychiatry and counseling appt. Pt also would like [place] to get her an intake appt in [place] area after Nov 4. Pt has medicare and [employer].

## 2016-05-27 NOTE — Progress Notes (Signed)
Group Therapy Note    Date: 05/27/2016  Start Time: 415  End Time:  500  Number of Participants: 16    Type of Group: Healthy Living/Wellness/meet and greet     Wellness Binder Information  Module Name:  Power of positivity   Session Number:  na    Patient's Goal:  Patient will be able to identify rules and flow of floor, patient will be able to share why its important to be positive.   Notes: patient easily engaged in group sharing and willing to answers question.     Status After Intervention:  Improved    Participation Level: Active Listener and Interactive    Participation Quality: Appropriate, Attentive and Sharing      Speech:  normal      Thought Process/Content: Logical      Affective Functioning: Congruent      Mood: euthymic      Level of consciousness:  Alert, Oriented x4 and Attentive      Response to Learning: Able to verbalize/acknowledge new learning, Able to retain information and Progressing to goal      Endings: None Reported    Modes of Intervention: Education, Support, Socialization, Exploration and Problem-solving      Discipline Responsible: Psychoeducational Specialist      Signature:  Stryker Veasey W Adrain Butrick, CTRS

## 2016-05-27 NOTE — Progress Notes (Signed)
DATE OF SERVICE:     05/27/2016    Regina Nguyen seen today for the purpose of continuation of care.  Nursing, social work reports, laboratory studies and vital signs are reviewed.      Patient chief complaint today is:             [x]  Depression      [x]  Anxiety        []  Psychosis         []  Suicidal/Homicidal                         []  Delusions           []  Aggression          Subjective: Patient reports improving mood, less anxiety.  Denies SI, HI, SIB and contracts for safety.  Plans to return to [place] at discharge.  To continue current medications, plan for discharge tomorrow.    Depression:  [x]  Mild []  Moderate []  Severe                []  Constant [x]  Sporadic     Anxiety: []  Mild [x]  Moderate []  Severe    []  Constant [x]  Sporadic     Delusions: []  Mild []  Moderate []  Severe     []  Constant []  Sporadic     []  Paranoid []  Somatic []  Grandiose     Hallucinations: []  Mild []  Moderate []  Severe     []  Constant []  Sporadic    []  Auditory  []  Visual []  Tactile       Suicidal: []  Constant []  Sporadic  Homicidal: []  Constant []  Sporadic    Unscheduled Medications     []  Patient Receiving Emergency Medications " Chemical Restraint"   [x]  Requesting PRN medications for anxiety    Psychiatric Review of systems  Delusions:  [x]  Denies []  Endorses   Withdrawals:  [x]  Denies []  Endorses    Hallucinations: [x]  Denies []  Endorses    Extra Pyramidal Symptoms: [x]  Denies []  Endorses      ROS:  [x]  All negative/unchanged except if checked. Explain positive(checked items) below:  []  Constitutional  []  Eyes  []  Ear/Nose/Mouth/Throat  []  Respiratory  []  CV  []  GI  []  GU  []  Musculoskeletal  []  Skin/Breast  []  Neurological  []  Endocrine  []  Heme/Lymph  []  Allergic/Immunologic      Mental Status Examination:    Cognition:  [x]  Alert  [x]  Awake  [x]  Oriented  [x]  Person  [x]  Place [x]  Time  Attention/Concentration:   [x]  Attentive  []  Distracted      Memory Recent and Remote:  []  Impaired []  Partially Impaired  [x]  Intact   Language:  [x]  Able to recognize and name   []  Unable to recognize and name   Fund of Knowledge:  []  Poor []   Fair  [x]  Good    Speech:  [x]  Soft  [x]  Slow  []  Fast []  Pressured  []  Loud   Appearance: []  Well Groomed  [x]  Casual Dressed  []  Unkept  []  Disheveled          Attitude: [x]  Positive  []  Hostile  []  Demanding  []  Guarded  []  Defensive         [x]  Cooperative  []   Uncooperative    Behavior:  [x]  Normal Gait  []  Walks with Assistance  []  Geri Chair []  Walks with Walker  []  In Hospital Bed  []  Sitting in Chair  Muscle-Skeletal:  [x]  Normal Muscle Tone []  Muscle Atrophy       []  Abnormal Muscle Movement     Eye Contact:  [x]  Good eye contact  []  Intermittent Eye Contact  []  Poor Eye Contact     Mood: []  Depressed  []  Anxious  []  Irritated  [x]  Euthymic      Affect:  [x]  Congruent  []  Incongruent  []  Labile  []  Constricted  []  Flat  []  Bizarre     Thought Process and Association:  [x]  Logical []  Illogical       [x]  Linear and Goal Directed  []  Tangential  []  Circumstantial     Thought Content:  [x]  Denies []  Endorses []  Suicidal []  Homicidal  []  Delusional      []  Paranoid  []  Somatic       Perception: [x]   None  []  Auditory   []  Visual     Insight and Judgment  Fair    Assessment/Plan: Symptoms improving.  Plan for discharge tomorrow.       Patient Active Problem List   Diagnosis Code   ??? 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 Mild tetrahydrocannabinol (THC) abuse F12.10   ??? Suicidal ideation R45.851         Plan:    [x]  Improving as expected   []  Not improving as expected   []  Worsening    []   At Baseline     Signed:  Salena Saner [facility]  05/27/2016  10:43 AM

## 2016-05-27 NOTE — Progress Notes (Signed)
Group Therapy Note    Date: 05/27/2016  Start Time: 1005  End Time:  1055  Number of Participants: 5    Type of Group: Psychotherapy    Wellness Binder Information  Module Name:  n/a  Session Number:  n/a    Patient's Goal:  To engage in healthy interactions to manage mood and symptoms; to develop insight into ways to improve interactions with others.    Notes:  Pt was able to express feelings re: support system. Pt was given positive support and feedback from the group. Focus of group was on ways to increase communication and improve relationships. Pt relates well to other group members and was able to provide support to others.    Status After Intervention:  Improved    Participation Level: Active Listener and Interactive    Participation Quality: Appropriate, Attentive, Sharing and Supportive      Speech:  normal      Thought Process/Content: Logical      Affective Functioning: Blunted      Mood: anxious and depressed      Level of consciousness:  Alert, Oriented x4 and Attentive      Response to Learning: Able to verbalize current knowledge/experience, Able to verbalize/acknowledge new learning and Progressing to goal      Endings: None Reported    Modes of Intervention: Support, Socialization and Exploration      Discipline Responsible: Social Worker/Counselor

## 2016-05-27 NOTE — Plan of Care (Signed)
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 Patient denies suicidal thoughts and auditory/visual hallucinations at this time.

## 2016-05-28 MED ORDER — QUETIAPINE FUMARATE 200 MG PO TABS
200 MG | ORAL_TABLET | Freq: Every evening | ORAL | 1 refills | Status: DC
Start: 2016-05-28 — End: 2016-06-30

## 2016-05-28 NOTE — Progress Notes (Signed)
[facility]  Discharge Note    Pt discharged with followings belongings:   Dentures: None  Vision - Corrective Lenses: None  Hearing Aid: None  Jewelry: Earrings  Clothing: Footwear, Jacket / coat, Pants, Shirt, Undergarments (Comment)  Were All Patient Medications Collected?: Not Applicable  Other Valuables: Wallet (debit card and ID for husband sent with patient's family, remaining contents of wallet sent to safe on 7W)   Valuables sent home with patient. Valuables retrieved from safe and returned to the patient.  Patient left department with Departure Mode: By self via Mobility at Departure: Ambulatory, discharged to Discharged to: Other (Comment) (patient discharged to mom in law's house to meet her husband to travel back to [place] today). Patient education on aftercare instructions: yes     Patient verbalize understanding of AVS:yes    Status and Exam  Normal: Yes  Facial Expression: Brightened, Worried (worried about correlating her trip so she can get back to [place] with her husband)  Affect: Appropriate, Congruent  Level of Consciousness: Alert  Mood:Normal: Yes  Mood: Anxious  Motor Activity:Normal: Yes  Motor Activity: Increased  Interview Behavior: Cooperative  Preception: Orient to Person, Orient to Time, Orient to Place, Orient to Situation  Attention:Normal: Yes  Attention: Distractible  Thought Processes: Other(See comment) (organized and relevant )  Thought Content:Normal: Yes  Thought Content: Preoccupations  Hallucinations: None  Delusions: No  Delusions: Obsessions  Memory:Normal: Yes  Memory: Confabulation  Insight and Judgment: Yes  Insight and Judgment: Other(See comment) (improved)  Present Suicidal Ideation: No  Present Homicidal Ideation: No    Patient given personal belongings and AVS as well as script for medication. Patient verbalized understanding of instructions and agrees to remain actively involved in her care upon discharge.  Zackery BarefootJessica Sallyanne Birkhead, RN

## 2016-05-28 NOTE — Progress Notes (Signed)
Pt. Slept all shift, no distress noted, breathing unlabored, eyes closed, will monitor q 15 min for safety.

## 2016-05-28 NOTE — Plan of Care (Signed)
Problem: Altered Mood, Psychotic Behavior  Goal: LTG-Able to verbalize reality based thinking  Outcome: Met This Shift  During morning assessment, patient demonstrated logical and realistic thought processes. 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 Patient currently denies suicidal and homicidal ideations as well as hallucinations.

## 2016-05-28 NOTE — Discharge Instructions (Signed)
Discharge Planning is  Complete.    Discharge Date: 05/28/16  Reason for Hospitalization: exacerbation of mania and psychosis  Discharge Diagnosis: Manic Bipolar disorder with Psychotic behavior    Your instructions:  Your physician here was Dr. Doylene Canardock. If you have any questions please call the unit at [phone]    The Crisis Number for [facility] is [phone] x 5    Help Hotline 979-476-4284 or [phone] or 211   24 hour crisis line for the following counties  [place]  [place]   [place]   www.helphotline.org    [place] LINE: [phone]  Monday through Friday 4pm to 8pm and Saturday 1pm-3pm   You Regina call during these times to talk with a peer support person as needed         Your next appointment is:   Regina Nguyen    [place] for counseling on   [facility]   Address; [address]  [place], [place]                                                Phone :  [phone]                                                Fax No.  [phone]                                                                                   2    Other appointments:   In the event that you return to [place] and would like to have counseling/psych services, this is the community agency:  [facility]  Address: [address], [place], [place] [address]   Phone number [phone]  Fax [phone]            The following personal items were collected during your admission and were returned to you:    Valuables  Dentures: None  Vision - Corrective Lenses: None  Hearing Aid: None  Jewelry: Earrings  Clothing: Footwear, Jacket / coat, Pants, Shirt, Undergarments (Comment)  Were All Patient Medications Collected?: Not Applicable  Other Valuables: Wallet (debit card and ID for husband sent with patient's family, remaining contents of wallet sent to safe on 7W)  Valuables Given To:  (wallet sent to safe on 7W receipt  #[id number])      By signing below, I understand and acknowledge receipt of the instructions indicated above.

## 2016-05-28 NOTE — Discharge Summary (Signed)
Physician Discharge Summary      Physical Examination   VS/Measurements:     BP (!) 154/96    Pulse 79    Temp 98.4 ??F (36.9 ??C) (Oral)    Resp 14    Ht 5\' 1"  (1.549 m)    Wt 102 lb (46.3 kg)    LMP 05/21/2016 (Exact Date)    SpO2 99%    BMI 19.27 kg/m??         Discharge Medications:              Elham, Fini   Home Medication Instructions UJW:[id number]    Printed on:05/28/16 1308   Medication Information                      QUEtiapine (SEROQUEL) 200 MG tablet  Take 1 tablet by mouth nightly                     Psychiatric:        Mood and affect:  [x]   Euthymic  []   Sad   []   Angry  []   Labile       Behavior:  [x]   Cooperative  [x]   Calm []   Anxious  []   Uncooperative       Judgment:  []   Poor    []   Fair    [x]   Good       Abnormal / Psychotic thoughts:  [x]   Denies SI/HI/Psychosis                     []  Endorses SI/HI/Psychosis       Thought process:  [x]   Logical   []   illogical     Hospital Course:   Admit Date: 05/21/2016     Discharge Date: 05/28/2016  Admitted from:  [x]   Emergency Room  []   Home  []   Another facility   []   [facility]     Admitting diagnosis:   Patient Active Problem List   Diagnosis   ??? Severe manic bipolar 1 disorder with psychotic behavior (HCC)   ??? Mild tetrahydrocannabinol (THC) abuse   ??? Suicidal ideation      Length of stay:  8 days              Rhonna Holster was admitted in Psychiatric unit  from the ER with depression, anxiety, SI and psychosis.  Patient was treated            with Risperdal, Seroquel and PRN medications. Patient responded well to the treatment.     Discharge Plan  Discharge Summary Plan:     Discharge Status:    [x]  Improved []  Unchanged    []  Worse       Discharge instructions given:  [x]  Patient    []  Family []  Other         Discharge disposition:  [x]  Home []  Step Down unit  []  Group Home []   [facility]                                                    []  [facility]    []  Other     Prescriptions: Continue same medications, review with patient.     Diagnosis:         Patient Active Problem List  Diagnosis Code   ??? 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 Mild tetrahydrocannabinol (THC) abuse F12.10   ??? Suicidal ideation R45.851       Education and Follow-up: At discharge, the patient denied SI, HI, SIB and contracted for safety.  The patient denied mania or psychosis.  Medications were well tolerated.  Counseled:  [x]  Patient     []  Family    []  Guardian      Signed:   Michael BostonJason Edward Taegan Standage   05/28/2016   7:22 AM

## 2016-05-28 NOTE — Progress Notes (Signed)
Patient attended community meeting.  Shared daily goal of "   To change the world".  Excited about leaving soon.

## 2016-06-08 ENCOUNTER — Inpatient Hospital Stay: Admit: 2016-06-08 | Discharge: 2016-06-08 | Disposition: A | Discharge status: Home / Self Care

## 2016-06-08 ENCOUNTER — Encounter: Admit: 2016-06-08

## 2016-06-08 DIAGNOSIS — J40 Bronchitis, not specified as acute or chronic: Secondary | ICD-10-CM

## 2016-06-08 LAB — STREP SCREEN GROUP A THROAT: Strep Grp A PCR: NEGATIVE

## 2016-06-08 MED ORDER — PSEUDOEPH-BROMPHEN-DM 30-2-10 MG/5ML PO SYRP
2-30-10 MG/5ML | Freq: Four times a day (QID) | ORAL | 0 refills | Status: AC | PRN
Start: 2016-06-08 — End: 2016-06-13

## 2016-06-08 MED ORDER — PREDNISONE 20 MG PO TABS
20 MG | Freq: Once | ORAL | Status: AC
Start: 2016-06-08 — End: 2016-06-08
  Administered 2016-06-08: 15:00:00 40 mg via ORAL

## 2016-06-08 MED ORDER — PREDNISONE 10 MG PO TABS
10 MG | ORAL_TABLET | Freq: Every day | ORAL | 0 refills | Status: AC
Start: 2016-06-08 — End: 2016-06-13

## 2016-06-08 MED ORDER — ALBUTEROL SULFATE HFA 108 (90 BASE) MCG/ACT IN AERS
108 (90 Base) MCG/ACT | Freq: Four times a day (QID) | RESPIRATORY_TRACT | 0 refills | Status: AC | PRN
Start: 2016-06-08 — End: 2016-06-15

## 2016-06-08 MED FILL — PREDNISONE 20 MG PO TABS: 20 MG | ORAL | Qty: 2

## 2016-06-08 NOTE — ED Provider Notes (Signed)
[facility]  ??  Department of Emergency Medicine   ED  Provider Note  Admit Date/RoomTime: 06/08/2016  9:23 AM  ED Room: CHAIR 01/C1  Chief Complaint:   URI (since Tuesday, cough sore throat)    History of Present Illness   Source of history provided by:  patient.  History/Exam Limitations: none.      Regina Nguyen is a 40 y.o. old female with a past medical history of:   Past Medical History:   Diagnosis Date   ??? Asthma    ??? Hypertension    ??? Severe manic bipolar 1 disorder with psychotic behavior (HCC) 05/21/2016    presents to the emergency department by private vehicle and ambulatory, for nasal congestion, rhinorrhea, sore throat and cough, which began a few day(s) prior to arrival.  Since onset the symptoms have been persistent and moderate in severity. The symptoms are associated with cough, nasal congestion and sore throat.  There has been NO abdominal pain, appetite decrease, chest pain, conjunctivitis, diarrhea, dizziness, neck stiffness, rash, vomiting or wheezing.    ROS   Pertinent positives and negatives are stated within HPI, all other systems reviewed and are negative.    Past Surgical History:  has no past surgical history on file.  Social History:  reports that she has been smoking Cigarettes.  She has been smoking about 0.50 packs per day. She has never used smokeless tobacco. She reports that she uses drugs, including Marijuana. She reports that she does not drink alcohol.  Family History: Family history is unknown by patient.   Allergies: Asa [aspirin]; Pcn [penicillins]; and Tylenol [acetaminophen]    Physical Exam           ED Triage Vitals [06/08/16 0920]   BP Temp Temp Source Pulse Resp SpO2 Height Weight   (!) 138/93 98 ??F (36.7 ??C) Oral 79 20 99 % -- --      Oxygen Saturation Interpretation: Normal.    ?? Constitutional:  Alert, development consistent with age.  ?? Ears:  External Ears: Bilateral normal.               TM's & External Canals: normal appearance.  ?? Nose:   There is clear  rhinorrhea.  ?? Sinuses: no Bilateral maxillary sinus tenderness.                    no Bilateral frontal sinus tenderness.  ?? Mouth:  normal tongue and buccal mucosa.   ?? Throat: no erythema or exudates noted. Teeth and gums normal., throat culture taken and mucous membranes moist.  Airway Patent.  ?? Neck:  Supple. There is no  preauricular, submental, parotid, anterior cervical, posterior cervical and supraclavicular node tenderness.  ?? Respiratory:   Breath sounds: Bilateral normal.  Lung sounds: normal.   ?? CV:  Regular rate and rhythm, normal heart sounds, without pathological murmurs, ectopy, gallops, or rubs.  ?? GI:  Abdomen Soft, nontender, good bowel sounds.  No firm or pulsatile mass.  ?? Integument:  Normal turgor.  Warm, dry, without visible rash.  ?? Neurological:  Oriented.  Motor functions intact.       Lab / Imaging Results   (All laboratory and radiology results have been personally reviewed by myself)  Labs:  Results for orders placed or performed during the hospital encounter of 06/08/16   Strep Screen Group A Throat   Result Value Ref Range    Strep Grp A PCR Negative Negative  Imaging:  All Radiology results interpreted by Radiologist unless otherwise noted.  XR CHEST STANDARD (2 VW)   Final Result   No evidence of acute pulmonary process.   Hyperexpanded lungs suggestive of COPD/emphysema.             ED Course / Medical Decision Making     Medications   predniSONE (DELTASONE) tablet 40 mg (not administered)     ED Course      Consults:   None    Procedures:   none    Medical Decision Making:    Based on moderate suspicion for pneumonia as per history/physical findings, imaging was done.  Upper respiratory infection is likely to  be viral in etiology. Antibiotics are not indicated at this time based on clinical presentation and physical findings. She is not hypoxic.  Patient is well appearing, non toxic and appropriate for outpatient management.   Plan of Care: Normal progression of disease  discussed.  All questions answered.  Explained the rationale for symptomatic treatment rather than use of an antibiotic.  Instruction provided in the use of fluids, vaporizer, acetaminophen, and other OTC medication for symptom control.  Extra fluids  Analgesics as needed, dose reviewed.  Follow up as needed should symptoms fail to improve.     Counseling:    The emergency provider has spoken with the patient and discussed today???s results, in addition to providing specific details for the plan of care and counseling regarding the diagnosis and prognosis.  Questions are answered at this time and they are agreeable with the plan.    Assessment     1. Bronchitis      Plan   Discharge to home  Patient condition is good    New Medications     New Prescriptions    ALBUTEROL SULFATE HFA (PROAIR HFA) 108 (90 BASE) MCG/ACT INHALER    Inhale 2 puffs into the lungs every 6 hours as needed for Wheezing    BROMPHENIRAMINE-PSEUDOEPHEDRINE-DM 30-2-10 MG/5ML SYRUP    Take 5 mLs by mouth 4 times daily as needed for Congestion or Cough    PREDNISONE (DELTASONE) 10 MG TABLET    Take 4 tablets by mouth daily for 5 days     Electronically signed by Carrolyn Leighhelsea Culley Hedeen, PA   DD: 06/08/16  **This report was transcribed using voice recognition software. Every effort was made to ensure accuracy; however, inadvertent computerized transcription errors may be present.  END OF ED PROVIDER NOTE         Carrolyn LeighChelsea Christene Pounds, [place]  06/08/16 1043

## 2016-06-08 NOTE — ED Notes (Signed)
Pt alert and oriented x4. Speech clear. Respirations easy/unlabored. Skin warm/dry. Appropriate color. No signs of acute distress noted. Pt/family teaching provided; verbalized understanding. Pt stable for discharge.       France Ravensolleen A Courtney, RN  06/08/16 1115

## 2016-06-30 ENCOUNTER — Emergency Department: Admit: 2016-06-30

## 2016-06-30 ENCOUNTER — Inpatient Hospital Stay: Admit: 2016-06-30 | Discharge: 2016-06-30 | Disposition: A | Discharge status: Home / Self Care

## 2016-06-30 DIAGNOSIS — R1084 Generalized abdominal pain: Secondary | ICD-10-CM

## 2016-06-30 LAB — COMPREHENSIVE METABOLIC PANEL
ALT: 11 U/L (ref 0–32)
AST: 23 U/L (ref 0–31)
Albumin: 3.7 g/dL (ref 3.5–5.2)
Alkaline Phosphatase: 64 U/L (ref 35–104)
Anion Gap: 11 mmol/L (ref 7–16)
BUN: 16 mg/dL (ref 6–20)
CO2: 24 mmol/L (ref 22–29)
Calcium: 8.8 mg/dL (ref 8.6–10.2)
Chloride: 101 mmol/L (ref 98–107)
Creatinine: 0.8 mg/dL (ref 0.5–1.0)
GFR African American: >60
GFR Non-African American: >60 mL/min/{1.73_m2} (ref >=60)
Glucose: 106 mg/dL (ref 74–109)
Potassium: 4.4 mmol/L (ref 3.5–5.0)
Sodium: 136 mmol/L (ref 132–146)
Total Bilirubin: 0.4 mg/dL (ref 0.0–1.2)
Total Protein: 7.3 g/dL (ref 6.4–8.3)

## 2016-06-30 LAB — CBC WITH AUTO DIFFERENTIAL
Basophils %: 0.7 % (ref 0.0–2.0)
Basophils Absolute: 0.03 E9/L (ref 0.00–0.20)
Eosinophils %: 1.3 % (ref 0.0–6.0)
Eosinophils Absolute: 0.06 E9/L (ref 0.05–0.50)
Hematocrit: 37.4 % (ref 34.0–48.0)
Hemoglobin: 12.3 g/dL (ref 11.5–15.5)
Immature Granulocytes #: 0.01 E9/L
Immature Granulocytes %: 0.2 % (ref 0.0–5.0)
Lymphocytes %: 21 % (ref 20.0–42.0)
Lymphocytes Absolute: 0.97 E9/L — ABNORMAL LOW (ref 1.50–4.00)
MCH: 26.7 pg (ref 26.0–35.0)
MCHC: 32.9 % (ref 32.0–34.5)
MCV: 81.3 fL (ref 80.0–99.9)
Monocytes %: 6.1 % (ref 2.0–12.0)
Monocytes Absolute: 0.28 E9/L (ref 0.10–0.95)
Neutrophils %: 70.7 % (ref 43.0–80.0)
Neutrophils Absolute: 3.26 E9/L (ref 1.80–7.30)
Platelets: 97 E9/L — ABNORMAL LOW (ref 130–450)
RBC: 4.6 E12/L (ref 3.50–5.50)
RDW: 16.1 fL — ABNORMAL HIGH (ref 11.5–15.0)
WBC: 4.6 E9/L (ref 4.5–11.5)

## 2016-06-30 LAB — MICROSCOPIC URINALYSIS

## 2016-06-30 LAB — URINALYSIS
Bilirubin Urine: NEGATIVE
Glucose, Ur: NEGATIVE mg/dL
Ketones, Urine: NEGATIVE mg/dL
Leukocyte Esterase, Urine: NEGATIVE
Nitrite, Urine: NEGATIVE
Protein, UA: NEGATIVE mg/dL
Specific Gravity, UA: 1.02 (ref 1.005–1.030)
Urobilinogen, Urine: 0.2 E.U./dL (ref <2.0)
pH, UA: 6 (ref 5.0–9.0)

## 2016-06-30 LAB — LACTIC ACID: Lactic Acid: 0.8 mmol/L (ref 0.5–2.2)

## 2016-06-30 LAB — POC PREGNANCY UR-QUAL: Preg Test, Ur: NEGATIVE

## 2016-06-30 LAB — MAGNESIUM: Magnesium: 2 mg/dL (ref 1.6–2.6)

## 2016-06-30 LAB — PLATELET CONFIRMATION

## 2016-06-30 LAB — LIPASE: Lipase: 30 U/L (ref 13–60)

## 2016-06-30 MED ORDER — SODIUM CHLORIDE 0.9 % IV BOLUS
0.9 % | Freq: Once | INTRAVENOUS | Status: AC
Start: 2016-06-30 — End: 2016-06-30
  Administered 2016-06-30: 07:00:00 1000 mL via INTRAVENOUS

## 2016-06-30 MED ORDER — ONDANSETRON 4 MG PO TBDP
4 MG | ORAL_TABLET | Freq: Three times a day (TID) | ORAL | 0 refills | Status: AC | PRN
Start: 2016-06-30 — End: ?

## 2016-06-30 MED ORDER — DICYCLOMINE HCL 10 MG PO CAPS
10 MG | ORAL_CAPSULE | Freq: Four times a day (QID) | ORAL | Status: AC | PRN
Start: 2016-06-30 — End: ?

## 2016-06-30 MED ORDER — METOCLOPRAMIDE HCL 5 MG/ML IJ SOLN
5 MG/ML | Freq: Once | INTRAMUSCULAR | Status: AC
Start: 2016-06-30 — End: 2016-06-30
  Administered 2016-06-30: 07:00:00 10 mg via INTRAVENOUS

## 2016-06-30 MED ORDER — DIPHENHYDRAMINE HCL 50 MG/ML IJ SOLN
50 MG/ML | Freq: Once | INTRAMUSCULAR | Status: AC
Start: 2016-06-30 — End: 2016-06-30
  Administered 2016-06-30: 07:00:00 12.5 mg via INTRAVENOUS

## 2016-06-30 MED ORDER — NORMAL SALINE FLUSH 0.9 % IV SOLN
0.9 % | INTRAVENOUS | Status: DC | PRN
Start: 2016-06-30 — End: 2016-06-30

## 2016-06-30 MED ORDER — DEXAMETHASONE SODIUM PHOSPHATE 10 MG/ML IJ SOLN
10 MG/ML | Freq: Once | INTRAMUSCULAR | Status: AC
Start: 2016-06-30 — End: 2016-06-30
  Administered 2016-06-30: 07:00:00 10 mg via INTRAVENOUS

## 2016-06-30 MED ORDER — IOPAMIDOL 76 % IV SOLN
76 % | Freq: Once | INTRAVENOUS | Status: AC | PRN
Start: 2016-06-30 — End: 2016-06-30
  Administered 2016-06-30: 10:00:00 110 mL via INTRAVENOUS

## 2016-06-30 MED FILL — DEXAMETHASONE SODIUM PHOSPHATE 10 MG/ML IJ SOLN: 10 MG/ML | INTRAMUSCULAR | Qty: 1

## 2016-06-30 MED FILL — DIPHENHYDRAMINE HCL 50 MG/ML IJ SOLN: 50 MG/ML | INTRAMUSCULAR | Qty: 1

## 2016-06-30 MED FILL — METOCLOPRAMIDE HCL 5 MG/ML IJ SOLN: 5 MG/ML | INTRAMUSCULAR | Qty: 2

## 2016-06-30 NOTE — ED Notes (Signed)
Pt c/o right sided headache with photophobia x a few days. Pt states she has no history of migraines and hasn't had a headache like this before. Pt states she feels her BP is high. IV access unsuccessful, labs obtained. Pt to be medicated once IV access is established.      Gurney MaxinKelly F Montoya Brandel, RN  06/30/16 [phone]

## 2016-06-30 NOTE — ED Notes (Signed)
Pt states pain is starting to lessen. Discharge instructions discussed. Pt verbalized understanding.      Gurney MaxinKelly F Marlies Ligman, RN  06/30/16 [phone]

## 2016-06-30 NOTE — ED Provider Notes (Signed)
Independent   HPI:  06/30/16, Time: 1:18 AM         Debroah BallerSharise Barbeau is a 40 y.o. female presenting to the ED for  migraine as well as abdominal pain.  Patient reports that 2 weeks ago she began to have abdominal pain with associated nausea, vomiting and diarrhea.  Initially she thought she had the GI flu but she reports she still having daily episodes of emesis and diarrhea.  She reports 2 of each today.  She states that over the past week as well she is having a headache.  She does express photophobia and phonophobia and occasional blurry vision with this.  She reports that she has not been eating or drinking very well.  States she's been taking Advil with short-term relief.  She denies any chest pain denies any shortness of breath.  She also denies any fevers with this.  She also denies any other illness exposure at home.  She denies it being the worse headache of her life she also denies any thunderclap onset.    Review of Systems:   Pertinent positives and negatives are stated within HPI, all other systems reviewed and are negative.          --------------------------------------------- PAST HISTORY ---------------------------------------------  Past Medical History:  has a past medical history of Asthma; Hypertension; and Severe manic bipolar 1 disorder with psychotic behavior (HCC).    Past Surgical History:  has no past surgical history on file.    Social History:  reports that she has been smoking Cigarettes.  She has been smoking about 0.50 packs per day. She has never used smokeless tobacco. She reports that she uses drugs, including Marijuana. She reports that she does not drink alcohol.    Family History: Family history is unknown by patient.     The patient???s home medications have been reviewed.    Allergies: Asa [aspirin]; Pcn [penicillins]; and Tylenol [acetaminophen]    -------------------------------------------------- RESULTS -------------------------------------------------  All laboratory and  radiology results have been personally reviewed by myself   LABS:  Results for orders placed or performed during the hospital encounter of 06/30/16   CBC Auto Differential   Result Value Ref Range    WBC 4.6 4.5 - 11.5 E9/L    RBC 4.60 3.50 - 5.50 E12/L    Hemoglobin 12.3 11.5 - 15.5 g/dL    Hematocrit 16.137.4 09.634.0 - 48.0 %    MCV 81.3 80.0 - 99.9 fL    MCH 26.7 26.0 - 35.0 pg    MCHC 32.9 32.0 - 34.5 %    RDW 16.1 (H) 11.5 - 15.0 fL    Platelets 97 (L) 130 - 450 E9/L    MPV NOT CALC 7.0 - 12.0 fL    Neutrophils % 70.7 43.0 - 80.0 %    Immature Granulocytes % 0.2 0.0 - 5.0 %    Lymphocytes % 21.0 20.0 - 42.0 %    Monocytes % 6.1 2.0 - 12.0 %    Eosinophils % 1.3 0.0 - 6.0 %    Basophils % 0.7 0.0 - 2.0 %    Neutrophils # 3.26 1.80 - 7.30 E9/L    Immature Granulocytes # 0.01 E9/L    Lymphocytes # 0.97 (L) 1.50 - 4.00 E9/L    Monocytes # 0.28 0.10 - 0.95 E9/L    Eosinophils # 0.06 0.05 - 0.50 E9/L    Basophils # 0.03 0.00 - 0.20 E9/L   Comprehensive Metabolic Panel   Result Value Ref Range  Sodium 136 132 - 146 mmol/L    Potassium 4.4 3.5 - 5.0 mmol/L    Chloride 101 98 - 107 mmol/L    CO2 24 22 - 29 mmol/L    Anion Gap 11 7 - 16 mmol/L    Glucose 106 74 - 109 mg/dL    BUN 16 6 - 20 mg/dL    CREATININE 0.8 0.5 - 1.0 mg/dL    GFR Non-African American >60 >=60 mL/min/1.73    GFR African American >60     Calcium 8.8 8.6 - 10.2 mg/dL    Total Protein 7.3 6.4 - 8.3 g/dL    Alb 3.7 3.5 - 5.2 g/dL    Total Bilirubin 0.4 0.0 - 1.2 mg/dL    Alkaline Phosphatase 64 35 - 104 U/L    ALT 11 0 - 32 U/L    AST 23 0 - 31 U/L   Magnesium   Result Value Ref Range    Magnesium 2.0 1.6 - 2.6 mg/dL   Lactic Acid, Plasma   Result Value Ref Range    Lactic Acid 0.8 0.5 - 2.2 mmol/L   Lipase   Result Value Ref Range    Lipase 30 13 - 60 U/L   Urinalysis   Result Value Ref Range    Color, UA Yellow Straw/Yellow    Clarity, UA Clear Clear    Glucose, Ur Negative Negative mg/dL    Bilirubin Urine Negative Negative    Ketones, Urine Negative  Negative mg/dL    Specific Gravity, UA 1.020 1.005 - 1.030    Blood, Urine TRACE (A) Negative    pH, UA 6.0 5.0 - 9.0    Protein, UA Negative Negative mg/dL    Urobilinogen, Urine 0.2 <2.0 E.U./dL    Nitrite, Urine Negative Negative    Leukocyte Esterase, Urine Negative Negative   Platelet Confirmation   Result Value Ref Range    Platelet Confirmation CONFIRMED    Microscopic Urinalysis   Result Value Ref Range    WBC, UA NONE 0 - 5 /HPF    RBC, UA 0-1 0 - 2 /HPF    Bacteria, UA NONE /HPF   POC Pregnancy Urine   Result Value Ref Range    Preg Test, Ur negative     QC OK? y        RADIOLOGY:  Interpreted by Radiologist.  CT ABDOMEN PELVIS W IV CONTRAST Additional Contrast? None   Final Result     Right adnexal cystic focus measuring 2.8 cm.        Fibroid uterus.        Mild intrahepatic biliary dilatation.        Indeterminate left adrenal gland nodule measuring 19 mm.  Recommend further    evaluation with unenhanced abdominal CT or MR.  Alternatively, if there is a    history of malignancy, consider PET.         This Final report was electronically signed by Thereasa Parkin, MD on 30 Jun 2016 5:13 AM EDT.      CT Head WO Contrast   Final Result     No acute intracranial abnormality.      This Final report was electronically signed by Thereasa Parkin, MD on 30 Jun 2016 5:07 AM EDT.          ------------------------- NURSING NOTES AND VITALS REVIEWED ---------------------------   The nursing notes within the ED encounter and vital signs as below have been reviewed.   BP (!) 183/97  Pulse 62    Temp 98.7 ??F (37.1 ??C)    Resp 16    Ht 5\' 1"  (1.549 m)    Wt 102 lb (46.3 kg)    SpO2 98%    BMI 19.27 kg/m??   Oxygen Saturation Interpretation: Normal      ---------------------------------------------------PHYSICAL EXAM--------------------------------------      Constitutional/General: Alert and oriented x3, well appearing, non toxic in NAD  Head: Normocephalic and atraumatic  Eyes: PERRL, EOMI  Mouth: Oropharynx clear, handling  secretions, no trismus  Neck: Supple, full ROM,   Pulmonary: Lungs clear to auscultation bilaterally, no wheezes, rales, or rhonchi. Not in respiratory distress  Cardiovascular:  Regular rate and rhythm, no murmurs, gallops, or rubs. 2+ distal pulses  Abdomen: Soft, non tender, non distended, bowel sounds present ??4 quadrants.  No point tenderness noted on exam.  Extremities: Moves all extremities x 4. Warm and well perfused  Skin: warm and dry without rash  Neurologic: GCS 15, cranial nerves II through XII grossly intact.  No acute neurovascular deficit noted.  Speech clear and coherent strengths are strong and equal bilaterally.  Negative for meningeal irritation.  Psych: Normal Affect      ------------------------------ ED COURSE/MEDICAL DECISION MAKING----------------------  Medications   0.9 % sodium chloride bolus (0 mLs Intravenous Stopped 06/30/16 0315)   diphenhydrAMINE (BENADRYL) injection 12.5 mg (12.5 mg Intravenous Given 06/30/16 0204)   metoclopramide (REGLAN) injection 10 mg (10 mg Intravenous Given 06/30/16 0205)   dexamethasone (DECADRON) injection 10 mg (10 mg Intravenous Given 06/30/16 0205)   iopamidol (ISOVUE-370) 76 % injection 110 mL (110 mLs Intravenous Given 06/30/16 0437)         ED COURSE:  ED Course        Medical Decision Making: Plan will be to obtain labs will also obtain imaging and medicated for symptom relief.  Patient likely is dehydrated from the episodes of vomiting and diarrhea which is probably the source of worsening headache but we will rule out TIA, versus CVA versus ICH.  Patient is negative for meningeal irritation.Labs reviewed, lipase negative lactic acid level negative magnesium level negative chemistry panel unremarkable.  No signs of dehydration noted.  CBC resulted platelet count is low at 97 patient's most recent platelet count just 1 month ago was 110 she appears to be low at baseline.  Will make patient aware in the need for close follow-up by primary care  physician.  Urine pregnancy negative, a urinalysis negative.  CT scan of brain is completely unremarkable.  CT of the abdomen and pelvis is showing a right adnexal cystic focus, fibroid uterus, mild intrahepatic biliary dilatation and indeterminate left adrenal gland nodule.  Recommend further follow-up patient was made aware of all lab and test results and the importance of follow-up care especially follow-up care with a primary care physician for additional imaging as needed.  Patient is neurovascular intact reevaluation headache is completely pain-free.  Patient did express understanding of the importance of follow-up care and to return back to the emergency room she develops any return of headache, nausea, vomiting, diarrhea, abdominal pain, chest pain, shortness breath, fevers or any other new additional concerns.  Patient expressed understanding and discharged home      Counseling:   The emergency provider has spoken with the patient and discussed today???s results, in addition to providing specific details for the plan of care and counseling regarding the diagnosis and prognosis.  Questions are answered at this time and they are agreeable with the plan.      ---------------------------------  IMPRESSION AND DISPOSITION ---------------------------------    IMPRESSION  1. Generalized abdominal pain    2. Nonintractable headache, unspecified chronicity pattern, unspecified headache type        DISPOSITION  Disposition: Discharge to home  Patient condition is good      NOTE: This report was transcribed using voice recognition software. Every effort was made to ensure accuracy; however, inadvertent computerized transcription errors may be present     Ellery Plunk, CNP  07/01/16 0151

## 2016-06-30 NOTE — ED Notes (Signed)
Urine spec obtained and sent. Pt ambulated to restroom without assistance. Pt was sleeping soundly upon this RN entering room. States there was no change in pain.      Gurney MaxinKelly F Kaleb Linquist, RN  06/30/16 [phone]

## 2016-07-10 LAB — HEPATITIS B SURFACE ANTIBODY: Hep B S Ab: NONREACTIVE

## 2016-07-11 LAB — VARICELLA ZOSTER ANTIBODY, IGG

## 2016-07-11 LAB — MEASLES/MUMPS/RUBELLA IMMUNITY

## 2016-09-13 ENCOUNTER — Encounter: Payer: MEDICARE | Attending: Pulmonary Disease

## 2018-06-14 IMAGING — CT CT HEAD W/O CM
3 of 4 series · 15 of 47 positions shown, 18 images · non-contrast
Comparison: 11/08/2014.

CLINICAL DATA: Ongoing migraine headache.

EXAM:
CT HEAD WITHOUT CONTRAST
TECHNIQUE: Contiguous axial images were obtained from the base of the skull
through the vertex without intravenous contrast.

[Series 2: head w/o · axial · non-contrast · 0.45mm/px · z∈[-139,-29]mm · 9 of 26 slices shown, 12 images]
[im 2/26  brain]
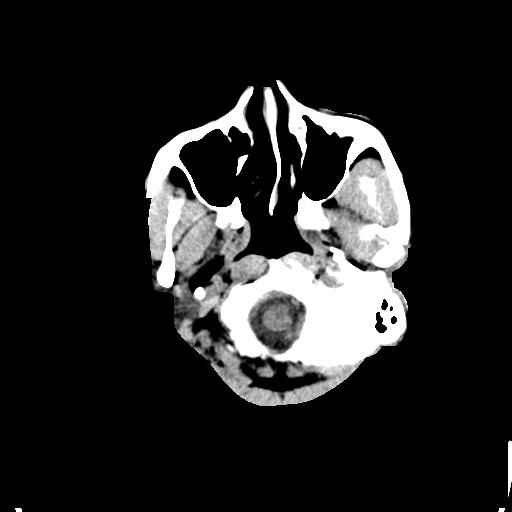
[im 2/26  bone]
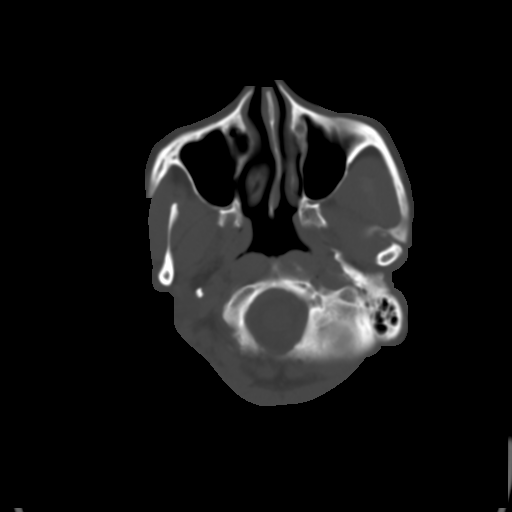
[im 6/26  brain]
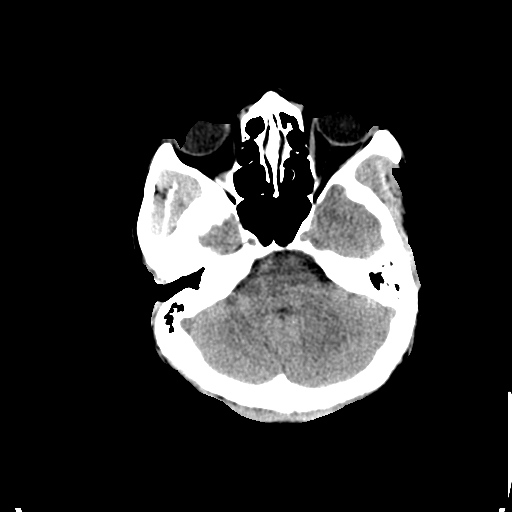
[im 8/26  brain]
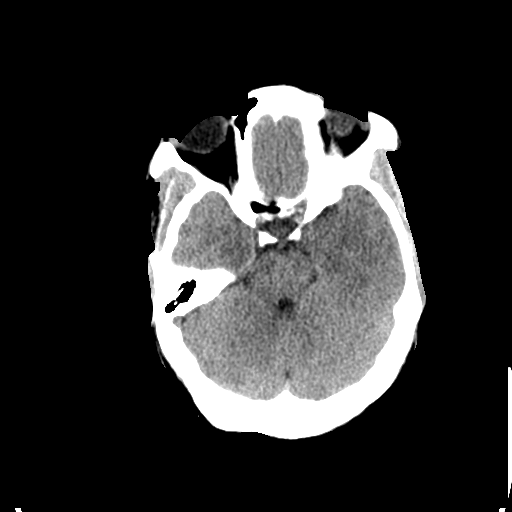
[im 11/26  brain]
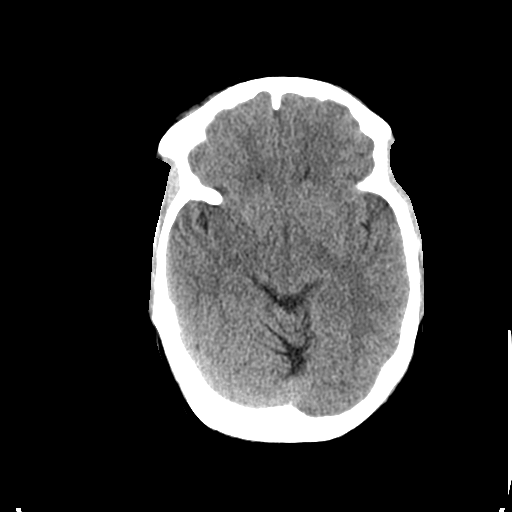
[im 13/26  brain]
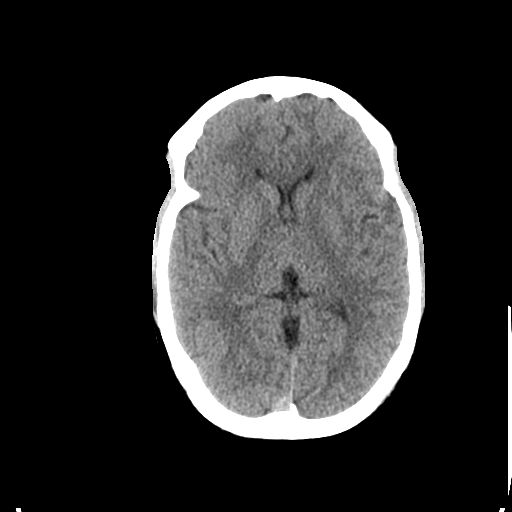
[im 13/26  bone]
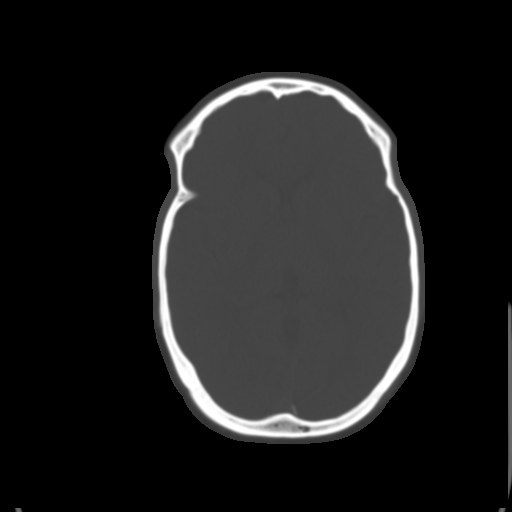
[im 15/26  brain]
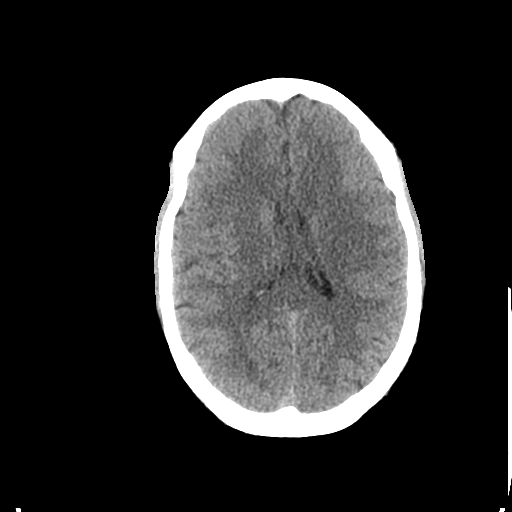
[im 18/26  brain]
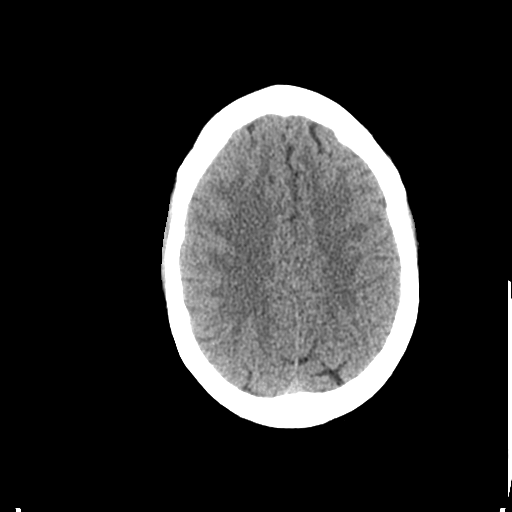
[im 20/26  brain]
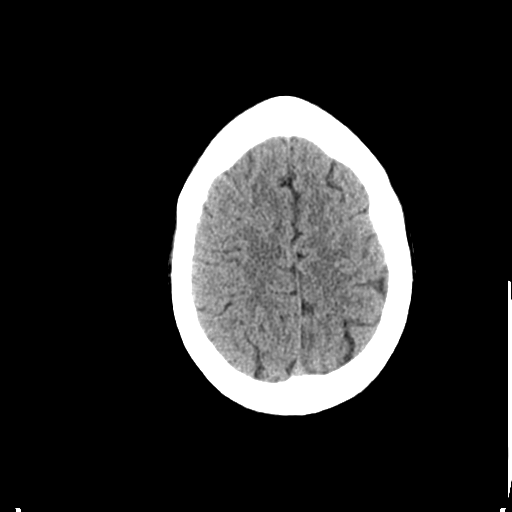
[im 24/26  brain]
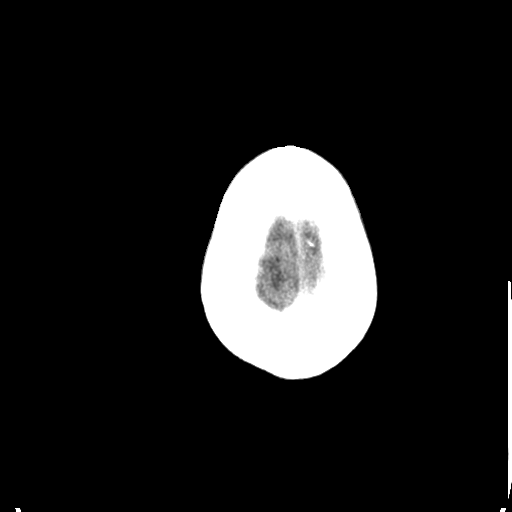
[im 24/26  bone]
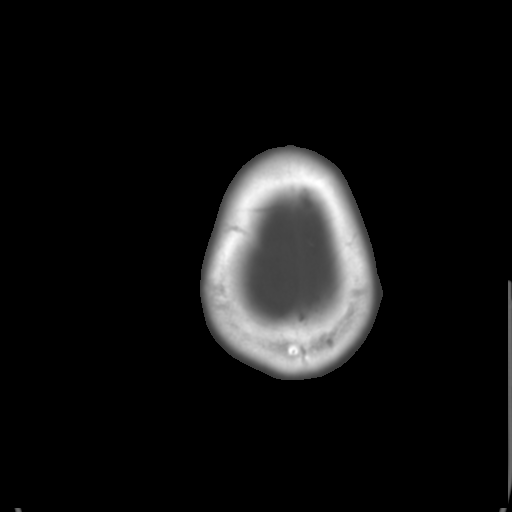

[Series 5: coronal · coronal · 0.26mm/px · 3 of 62 slices shown]
[im 21/62  brain]
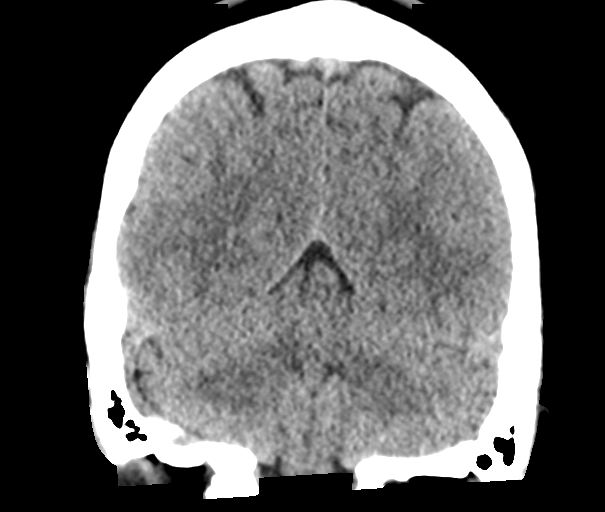
[im 28/62  brain]
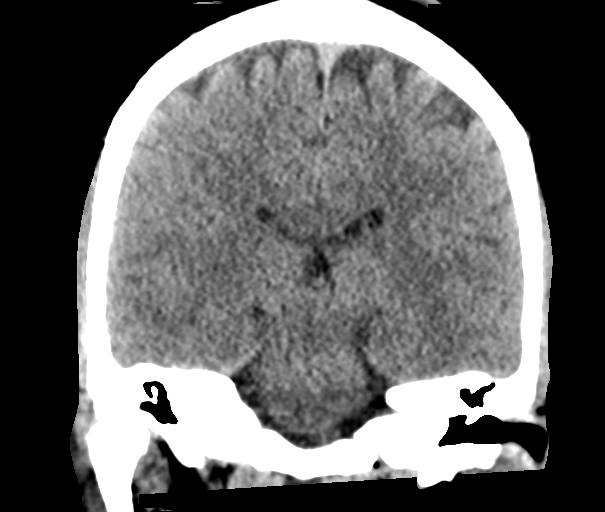
[im 34/62  brain]
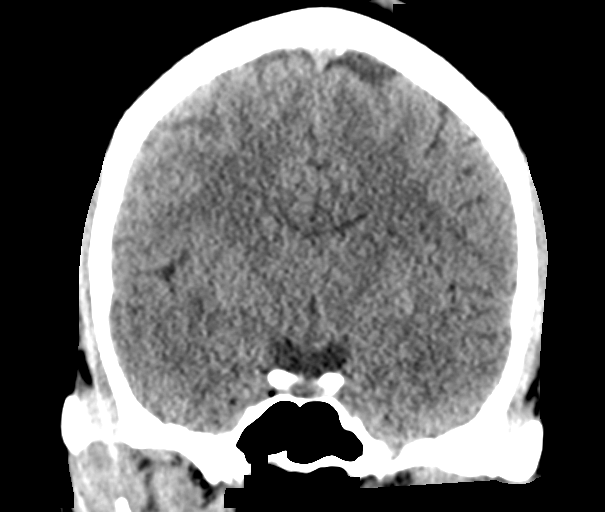

[Series 6: sagittal · sagittal · 0.25mm/px · 3 of 49 slices shown]
[im 17/49  brain]
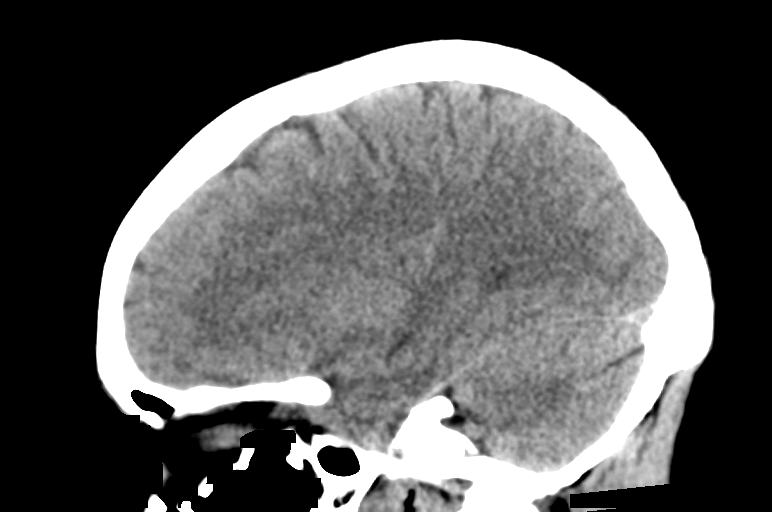
[im 25/49  brain]
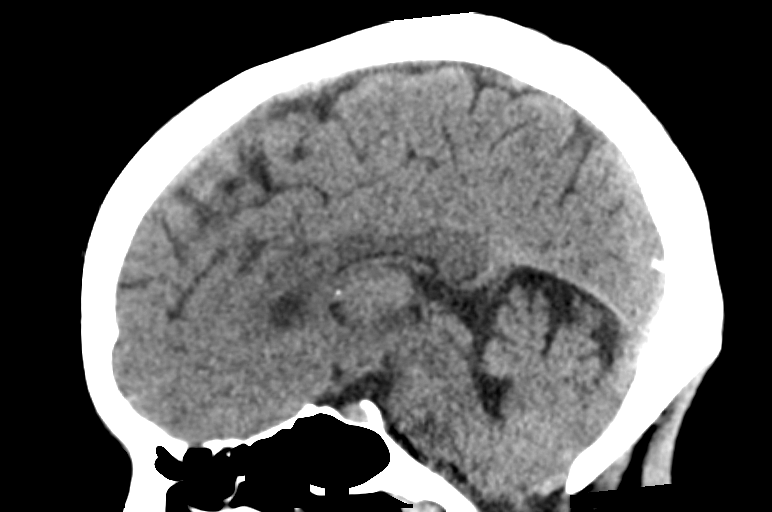
[im 33/49  brain]
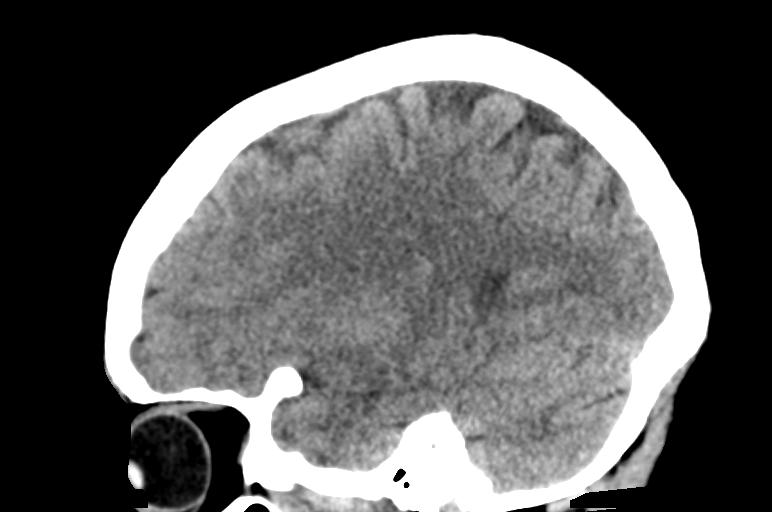

[15 of 47 positions shown; findings below may reference images not displayed]

FINDINGS: There is no evidence for acute hemorrhage, hydrocephalus, mass
lesion, or abnormal extra-axial fluid collection. No definite CT
evidence for acute infarction. The visualized paranasal sinuses and
mastoid air cells are clear.
IMPRESSION: Normal CT evaluation of the brain.

## 2023-08-05 ENCOUNTER — Ambulatory Visit (HOSPITAL_COMMUNITY)
Admission: EM | Admit: 2023-08-05 | Discharge: 2023-08-05 | Disposition: A | Discharge status: Home / Self Care | Payer: 59

## 2023-08-05 ENCOUNTER — Encounter (HOSPITAL_COMMUNITY): Payer: Self-pay | Admitting: Emergency Medicine

## 2023-08-05 DIAGNOSIS — J069 Acute upper respiratory infection, unspecified: Secondary | ICD-10-CM

## 2023-08-05 DIAGNOSIS — R051 Acute cough: Secondary | ICD-10-CM | POA: Diagnosis not present

## 2023-08-05 LAB — POC COVID19/FLU A&B COMBO
Covid Antigen, POC: NEGATIVE
Influenza A Antigen, POC: NEGATIVE
Influenza B Antigen, POC: NEGATIVE

## 2023-08-05 MED ORDER — AEROCHAMBER PLUS FLO-VU LARGE MISC
1.0000 | Freq: Once | Status: AC
  Administered 2023-08-05: 1

## 2023-08-05 MED ORDER — IBUPROFEN 600 MG PO TABS: 600.0000 mg | ORAL_TABLET | Freq: Three times a day (TID) | ORAL | 0 refills | Status: AC | PRN

## 2023-08-05 MED ORDER — PROMETHAZINE-DM 6.25-15 MG/5ML PO SYRP: 5.0000 mL | ORAL_SOLUTION | Freq: Two times a day (BID) | ORAL | 0 refills | Status: AC | PRN

## 2023-08-05 MED ORDER — AEROCHAMBER PLUS FLO-VU LARGE MISC
Status: AC
  Filled 2023-08-05: qty 1

## 2023-08-05 MED ORDER — ALBUTEROL SULFATE HFA 108 (90 BASE) MCG/ACT IN AERS
2.0000 | INHALATION_SPRAY | Freq: Once | RESPIRATORY_TRACT | Status: AC
  Administered 2023-08-05: 2 via RESPIRATORY_TRACT

## 2023-08-05 MED ORDER — ALBUTEROL SULFATE HFA 108 (90 BASE) MCG/ACT IN AERS
INHALATION_SPRAY | RESPIRATORY_TRACT | Status: AC
  Filled 2023-08-05: qty 6.7

## 2023-08-05 NOTE — Discharge Instructions (Addendum)
You tested negative for flu and COVID.  I suspect you have a different virus.  Use the albuterol every 4-6 hours as needed for shortness of breath and coughing fits.  Use ibuprofen for discomfort and to help with your symptoms.  Do not take additional NSAIDs with this medicine including aspirin, ibuprofen/Advil, naproxen/Aleve.  Use Promethazine DM for cough.  This will make you sleepy so do not drive drink alcohol while taking it.  Make sure you rest and drink plenty of fluids.  If your symptoms are not improving within a week or if anything worsens and you have worsening cough, high fever, shortness of breath, nausea/vomiting you should be seen immediately.

## 2023-08-05 NOTE — ED Triage Notes (Signed)
Pt c/o chest pain, sore throat, cough, congestion for two days.   She is asking for COVID testing

## 2023-08-05 NOTE — ED Provider Notes (Signed)
[facility]    CSN: [id number] Arrival date & time: 08/05/23  0807      History   Chief Complaint Chief Complaint  Patient presents with   Cough   Sore Throat    HPI Amber Ramirez is a 47 y.o. female.   Patient presents today with a 2-day history of URI symptoms.  She reports chest tightness/discomfort, sore throat, cough, congestion.  Denies any fever, chest pain, nausea, vomiting, diarrhea.  She denies any known sick contacts but has been visiting family members in the hospital.  She has had COVID several times in the past but not in the past 90 days.  She denies any history of allergies, asthma, COPD but does smoke daily.  She denies any recent antibiotics or steroids.  She has not tried any over-the-counter medication for symptom management.  She is eating and drinking normally.  She does report significant situational stressors as she has lost several close family members and her best friend is on hospice.    Past Medical History:  Diagnosis Date   Anemia    Bipolar 1 disorder (HCC)    Depression    Heart murmur    Hypertension     Patient Active Problem List   Diagnosis Date Noted   Acute psychosis (HCC) 11/09/2014    Past Surgical History:  Procedure Laterality Date   TUBAL LIGATION      OB History     Gravida  3   Para  3   Term  3   Preterm      AB      Living  3      SAB      IAB      Ectopic      Multiple      Live Births               Home Medications    Prior to Admission medications   Medication Sig Start Date End Date Taking? Authorizing Provider  amLODipine (NORVASC) 10 MG tablet Take 20 mg by mouth daily.   Yes [provider]  ibuprofen (ADVIL) 600 MG tablet Take 1 tablet (600 mg total) by mouth every 8 (eight) hours as needed. 08/05/23  Yes Karena Kinker K, PA-C  promethazine-dextromethorphan (PROMETHAZINE-DM) 6.25-15 MG/5ML syrup Take 5 mLs by mouth 2 (two) times daily as needed for cough.  08/05/23  Yes Mariapaula Krist K, PA-C  clindamycin (CLEOCIN) 150 MG capsule Take 1 capsule (150 mg total) by mouth 3 (three) times daily. X 7 days Patient not taking: Reported on 12/13/2015 12/26/14   Ria Clock, PA  hydrochlorothiazide (MICROZIDE) 12.5 MG capsule Take 1 capsule (12.5 mg total) by mouth daily. 03/07/16   Ozella Rocks, MD  ondansetron (ZOFRAN) 4 MG tablet Take 1 tablet (4 mg total) by mouth every 6 (six) hours. Patient not taking: Reported on 08/05/2023 02/27/16   Melton Krebs, PA-C    Family History Family History  Problem Relation Age of Onset   Schizophrenia Mother    Cancer Mother        lung   Hypertension Mother    Diabetes Father     Social History Social History   Tobacco Use   Smoking status: Every Day    Current packs/day: 1.00    Average packs/day: 1 pack/day for 16.0 years (16.0 ttl pk-yrs)    Types: Cigars, Cigarettes   Smokeless tobacco: Never   Tobacco comments:    black and  mild-4-5/day  Substance Use Topics   Alcohol use: Yes    Comment: rare   Drug use: No     Allergies   Penicillins   Review of Systems Review of Systems  Constitutional:  Positive for activity change. Negative for appetite change, fatigue and fever.  HENT:  Positive for congestion and sore throat. Negative for sinus pressure and sneezing.   Respiratory:  Positive for cough. Negative for shortness of breath.   Cardiovascular:  Negative for chest pain.  Gastrointestinal:  Negative for abdominal pain, diarrhea, nausea and vomiting.  Neurological:  Negative for dizziness, light-headedness and headaches.     Physical Exam Triage Vital Signs ED Triage Vitals  Encounter Vitals Group     BP 08/05/23 0823 132/85     Systolic BP Percentile --      Diastolic BP Percentile --      Pulse Rate 08/05/23 0823 80     Resp 08/05/23 0823 17     Temp 08/05/23 0823 97.7 F (36.5 C)     Temp Source 08/05/23 0823 Oral     SpO2 08/05/23 0823 97 %     Weight  --      Height --      Head Circumference --      Peak Flow --      Pain Score 08/05/23 0820 10     Pain Loc --      Pain Education --      Exclude from Growth Chart --    No data found.  Updated Vital Signs BP 132/85 (BP Location: Left Arm)   Pulse 80   Temp 97.7 F (36.5 C) (Oral)   Resp 17   LMP 02/12/2016 Comment: negative HCG blood test 02-27-2016  SpO2 97%   Visual Acuity Right Eye Distance:   Left Eye Distance:   Bilateral Distance:    Right Eye Near:   Left Eye Near:    Bilateral Near:     Physical Exam Vitals reviewed.  Constitutional:      General: She is awake. She is not in acute distress.    Appearance: Normal appearance. She is well-developed. She is not ill-appearing.     Comments: Very pleasant female appears stated age in no acute distress sitting comfortably in exam room  HENT:     Head: Normocephalic and atraumatic.     Right Ear: Tympanic membrane, ear canal and external ear normal. Tympanic membrane is not erythematous or bulging.     Left Ear: Tympanic membrane, ear canal and external ear normal. Tympanic membrane is not erythematous or bulging.     Nose:     Right Sinus: No maxillary sinus tenderness or frontal sinus tenderness.     Left Sinus: No maxillary sinus tenderness or frontal sinus tenderness.     Mouth/Throat:     Pharynx: Uvula midline. No oropharyngeal exudate or posterior oropharyngeal erythema.  Cardiovascular:     Rate and Rhythm: Normal rate and regular rhythm.     Heart sounds: Normal heart sounds, S1 normal and S2 normal. No murmur heard. Pulmonary:     Effort: Pulmonary effort is normal.     Breath sounds: Normal breath sounds. No wheezing, rhonchi or rales.     Comments: Clear to auscultation bilaterally Chest:     Chest wall: Tenderness present. No deformity or swelling.     Comments: Mild tenderness palpation over anterior chest wall. Musculoskeletal:     Right lower leg: No edema.  Left lower leg: No edema.   Psychiatric:        Behavior: Behavior is cooperative.      UC Treatments / Results  Labs (all labs ordered are listed, but only abnormal results are displayed) Labs Reviewed  POC COVID19/FLU A&B COMBO    EKG   Radiology No results found.  Procedures Procedures (including critical care time)  Medications Ordered in UC Medications  AeroChamber Plus Flo-Vu Large MISC 1 each (1 each Other Given 08/05/23 0845)  albuterol (VENTOLIN HFA) 108 (90 Base) MCG/ACT inhaler 2 puff (2 puffs Inhalation Given 08/05/23 0845)    Initial Impression / Assessment and Plan / UC Course  I have reviewed the triage vital signs and the nursing notes.  Pertinent labs & imaging results that were available during my care of the patient were reviewed by me and considered in my medical decision making (see chart for details).     Patient is well-appearing, afebrile, nontoxic, nontachycardic.  No evidence of acute infection on physical exam that would warrant initiation of antibiotics.  Suspect viral etiology.  She does negative for COVID and flu in clinic.  She was given albuterol inhaler in clinic with improvement of symptoms.  She was sent home with this medication to be used every 4-6 hours as needed.  Will start ibuprofen 600 mg every 8 hours as needed for chest wall pain and bodyaches.  We discussed that she should not take NSAIDs with this medication due to risk of GI bleeding.  She was given Promethazine DM for cough.  We discussed that this can be sedating and she is not to drive or drink alcohol while taking it.  She is to rest and drink plenty of fluid.  Discussed that she is contagious until she has an improvement of symptoms for 24 hours and been fever free without medication for 24 hours.  Recommended that she avoid close contact with others including anyone in the hospital until she is feeling better.  Strict return precautions were given.  Final Clinical Impressions(s) / UC Diagnoses   Final  diagnoses:  Upper respiratory tract infection, unspecified type  Acute cough     Discharge Instructions      You tested negative for flu and COVID.  I suspect you have a different virus.  Use the albuterol every 4-6 hours as needed for shortness of breath and coughing fits.  Use ibuprofen for discomfort and to help with your symptoms.  Do not take additional NSAIDs with this medicine including aspirin, ibuprofen/Advil, naproxen/Aleve.  Use Promethazine DM for cough.  This will make you sleepy so do not drive drink alcohol while taking it.  Make sure you rest and drink plenty of fluids.  If your symptoms are not improving within a week or if anything worsens and you have worsening cough, high fever, shortness of breath, nausea/vomiting you should be seen immediately.     ED Prescriptions     Medication Sig Dispense Auth. Provider   promethazine-dextromethorphan (PROMETHAZINE-DM) 6.25-15 MG/5ML syrup Take 5 mLs by mouth 2 (two) times daily as needed for cough. 118 mL Umberto Pavek K, PA-C   ibuprofen (ADVIL) 600 MG tablet Take 1 tablet (600 mg total) by mouth every 8 (eight) hours as needed. 30 tablet Aydien Majette, Noberto Retort, PA-C      PDMP not reviewed this encounter.   Jeani Hawking, PA-C 08/05/23 1610
